# Patient Record
Sex: Female | Born: 1980 | Race: Black or African American | Hispanic: No
Health system: Midwestern US, Community
[De-identification: ages and names within clinical notes are randomized; demographics above are authoritative.]

## PROBLEM LIST (undated history)

## (undated) DIAGNOSIS — E119 Type 2 diabetes mellitus without complications: Secondary | ICD-10-CM

## (undated) DIAGNOSIS — B009 Herpesviral infection, unspecified: Secondary | ICD-10-CM

## (undated) HISTORY — PX: OTHER SURGICAL HISTORY: SHX169

## (undated) HISTORY — DX: Herpesviral infection, unspecified: B00.9

## (undated) HISTORY — DX: Type 2 diabetes mellitus without complications: E11.9

---
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
## 1998-02-28 ENCOUNTER — Inpatient Hospital Stay (HOSPITAL_COMMUNITY): Admission: AD | Admit: 1998-02-28 | Discharge: 1998-02-28 | Payer: Self-pay | Admitting: *Deleted

## 1998-11-28 ENCOUNTER — Inpatient Hospital Stay (HOSPITAL_COMMUNITY): Admission: AD | Admit: 1998-11-28 | Discharge: 1998-11-28 | Payer: Self-pay | Admitting: Obstetrics & Gynecology

## 2000-04-21 ENCOUNTER — Emergency Department (HOSPITAL_COMMUNITY): Admission: EM | Admit: 2000-04-21 | Discharge: 2000-04-21 | Payer: Self-pay | Admitting: Emergency Medicine

## 2000-09-20 ENCOUNTER — Inpatient Hospital Stay (HOSPITAL_COMMUNITY): Admission: AD | Admit: 2000-09-20 | Discharge: 2000-09-20 | Payer: Self-pay | Admitting: *Deleted

## 2000-09-20 ENCOUNTER — Encounter (INDEPENDENT_AMBULATORY_CARE_PROVIDER_SITE_OTHER): Payer: Self-pay

## 2000-10-03 ENCOUNTER — Encounter: Admission: RE | Admit: 2000-10-03 | Discharge: 2000-10-03 | Payer: Self-pay | Admitting: Obstetrics & Gynecology

## 2001-03-21 ENCOUNTER — Encounter: Admission: RE | Admit: 2001-03-21 | Discharge: 2001-03-21 | Payer: Self-pay

## 2001-09-02 ENCOUNTER — Other Ambulatory Visit: Admission: RE | Admit: 2001-09-02 | Discharge: 2001-09-02 | Payer: Self-pay | Admitting: Obstetrics and Gynecology

## 2001-10-16 ENCOUNTER — Inpatient Hospital Stay (HOSPITAL_COMMUNITY): Admission: AD | Admit: 2001-10-16 | Discharge: 2001-10-16 | Payer: Self-pay | Admitting: Obstetrics and Gynecology

## 2001-12-09 ENCOUNTER — Encounter: Admission: RE | Admit: 2001-12-09 | Discharge: 2001-12-16 | Payer: Self-pay | Admitting: Obstetrics and Gynecology

## 2001-12-19 ENCOUNTER — Encounter (HOSPITAL_COMMUNITY): Admission: AD | Admit: 2001-12-19 | Discharge: 2002-01-18 | Payer: Self-pay | Admitting: Obstetrics and Gynecology

## 2001-12-30 ENCOUNTER — Encounter: Payer: Self-pay | Admitting: Obstetrics and Gynecology

## 2002-01-06 ENCOUNTER — Encounter: Payer: Self-pay | Admitting: Obstetrics and Gynecology

## 2002-01-13 ENCOUNTER — Encounter: Payer: Self-pay | Admitting: Obstetrics and Gynecology

## 2002-01-20 ENCOUNTER — Encounter: Payer: Self-pay | Admitting: Obstetrics and Gynecology

## 2002-01-20 ENCOUNTER — Encounter (HOSPITAL_COMMUNITY): Admission: RE | Admit: 2002-01-20 | Discharge: 2002-01-21 | Payer: Self-pay | Admitting: Obstetrics and Gynecology

## 2002-01-23 ENCOUNTER — Inpatient Hospital Stay (HOSPITAL_COMMUNITY): Admission: AD | Admit: 2002-01-23 | Discharge: 2002-01-25 | Payer: Self-pay | Admitting: Obstetrics and Gynecology

## 2002-02-23 ENCOUNTER — Other Ambulatory Visit: Admission: RE | Admit: 2002-02-23 | Discharge: 2002-02-23 | Payer: Self-pay | Admitting: Obstetrics and Gynecology

## 2004-10-12 ENCOUNTER — Other Ambulatory Visit: Admission: RE | Admit: 2004-10-12 | Discharge: 2004-10-12 | Payer: Self-pay | Admitting: Obstetrics and Gynecology

## 2005-11-17 ENCOUNTER — Emergency Department (HOSPITAL_COMMUNITY): Admission: EM | Admit: 2005-11-17 | Discharge: 2005-11-17 | Payer: Self-pay | Admitting: Emergency Medicine

## 2005-12-23 ENCOUNTER — Emergency Department (HOSPITAL_COMMUNITY): Admission: EM | Admit: 2005-12-23 | Discharge: 2005-12-23 | Payer: Self-pay | Admitting: Emergency Medicine

## 2006-02-06 ENCOUNTER — Ambulatory Visit (HOSPITAL_COMMUNITY): Admission: RE | Admit: 2006-02-06 | Discharge: 2006-02-07 | Payer: Self-pay | Admitting: General Surgery

## 2006-02-06 ENCOUNTER — Encounter (INDEPENDENT_AMBULATORY_CARE_PROVIDER_SITE_OTHER): Payer: Self-pay | Admitting: *Deleted

## 2007-11-25 ENCOUNTER — Other Ambulatory Visit: Admission: RE | Admit: 2007-11-25 | Discharge: 2007-11-25 | Payer: Self-pay | Admitting: Gynecology

## 2008-04-28 ENCOUNTER — Ambulatory Visit: Payer: Self-pay | Admitting: Diagnostic Radiology

## 2008-04-28 ENCOUNTER — Emergency Department (HOSPITAL_BASED_OUTPATIENT_CLINIC_OR_DEPARTMENT_OTHER): Admission: EM | Admit: 2008-04-28 | Discharge: 2008-04-28 | Payer: Self-pay | Admitting: Emergency Medicine

## 2008-06-02 ENCOUNTER — Ambulatory Visit: Payer: Self-pay | Admitting: Gynecology

## 2008-07-05 ENCOUNTER — Ambulatory Visit: Payer: Self-pay | Admitting: Gynecology

## 2008-08-27 ENCOUNTER — Ambulatory Visit: Payer: Self-pay | Admitting: Diagnostic Radiology

## 2008-08-27 ENCOUNTER — Emergency Department (HOSPITAL_BASED_OUTPATIENT_CLINIC_OR_DEPARTMENT_OTHER): Admission: EM | Admit: 2008-08-27 | Discharge: 2008-08-27 | Payer: Self-pay | Admitting: Emergency Medicine

## 2010-01-09 ENCOUNTER — Emergency Department (HOSPITAL_BASED_OUTPATIENT_CLINIC_OR_DEPARTMENT_OTHER): Admission: EM | Admit: 2010-01-09 | Discharge: 2010-01-09 | Payer: Self-pay | Admitting: Emergency Medicine

## 2010-01-12 ENCOUNTER — Ambulatory Visit: Payer: Self-pay | Admitting: Gynecology

## 2010-01-12 ENCOUNTER — Other Ambulatory Visit: Admission: RE | Admit: 2010-01-12 | Discharge: 2010-01-12 | Payer: Self-pay | Admitting: Gynecology

## 2010-03-17 IMAGING — CR DG CHEST 2V
2 series · 2 of 2 positions shown · non-contrast
Comparison: 11/17/2005

CLINICAL DATA: Congestion, cough and shortness of breath.

CHEST - 2 VIEW

[w chest pa]
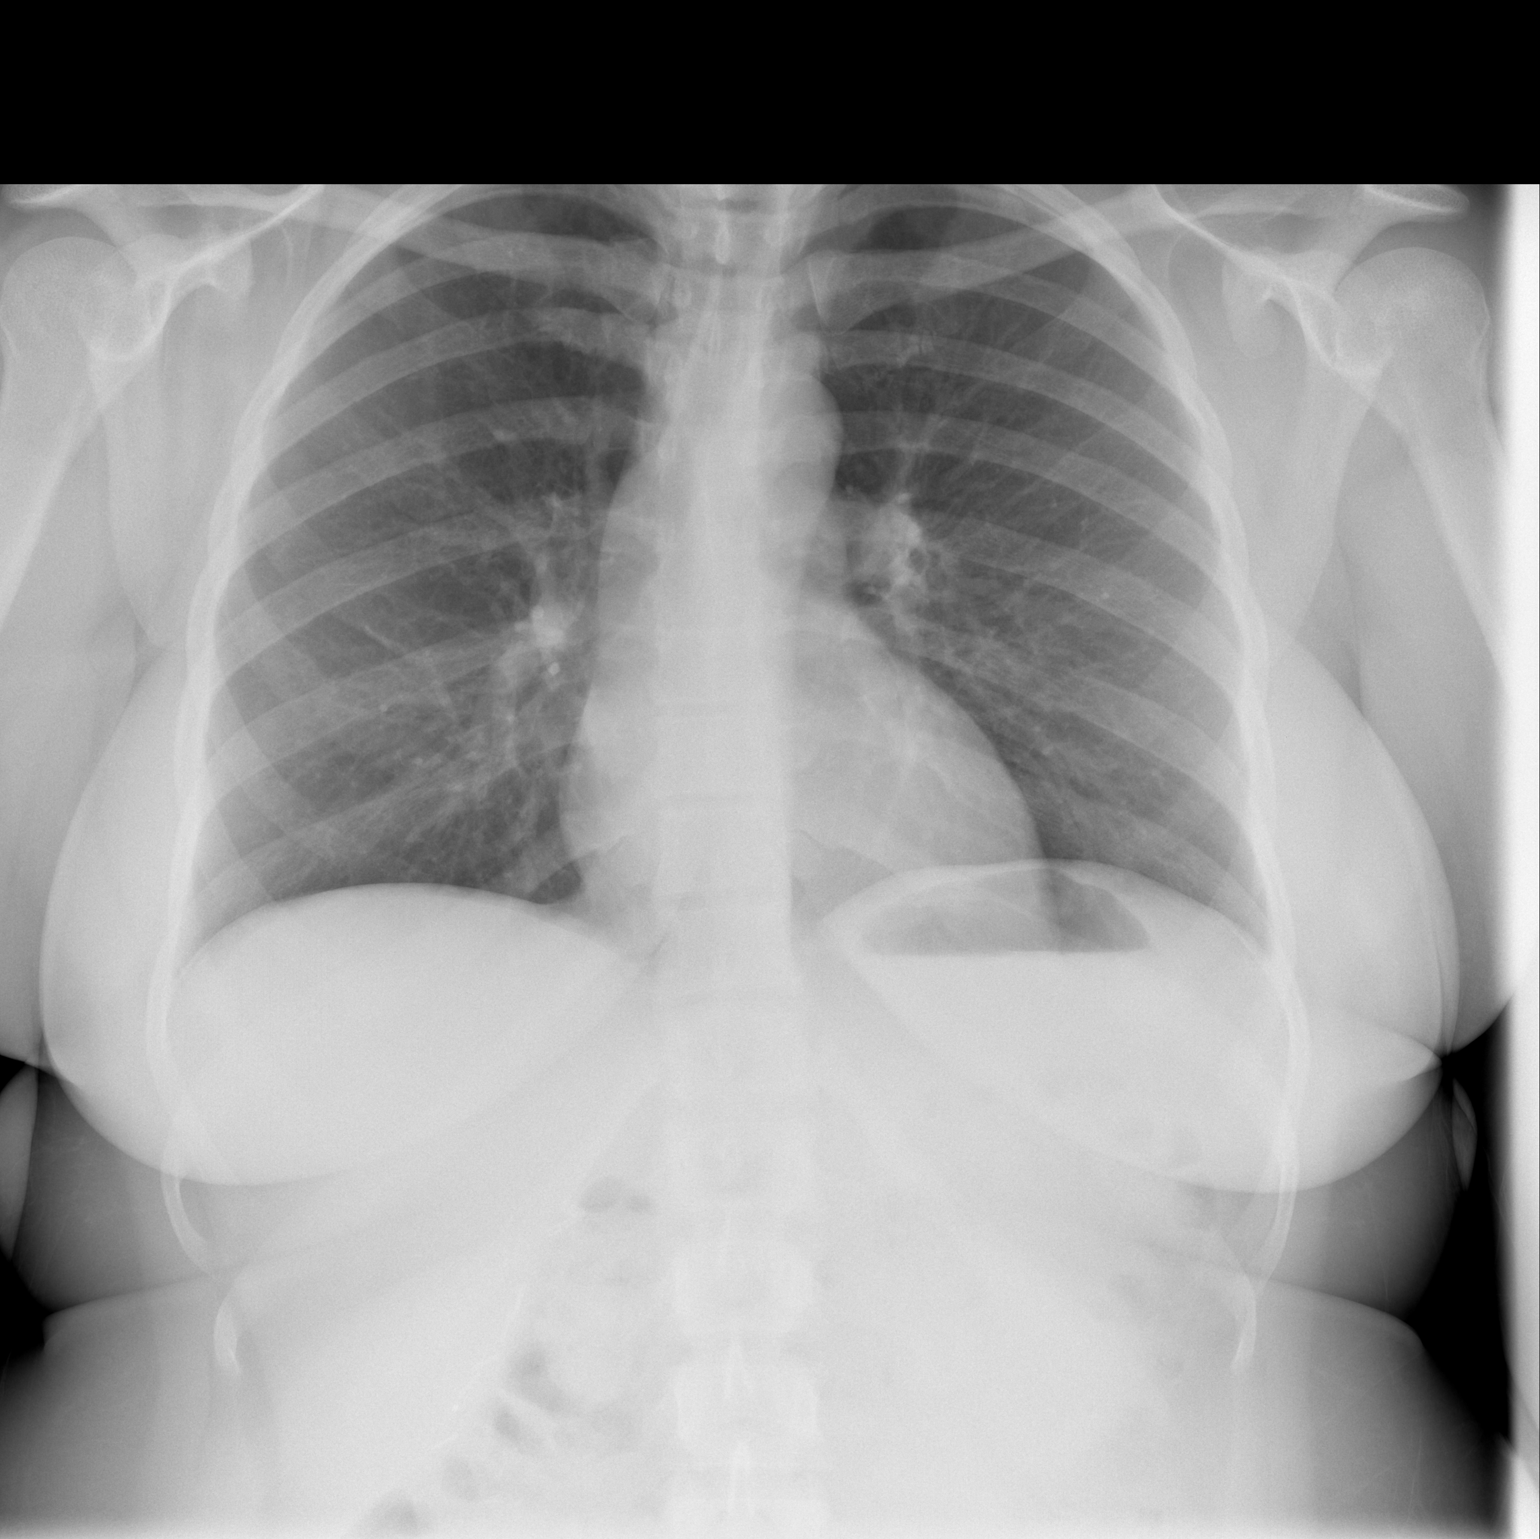

[w chest lat]
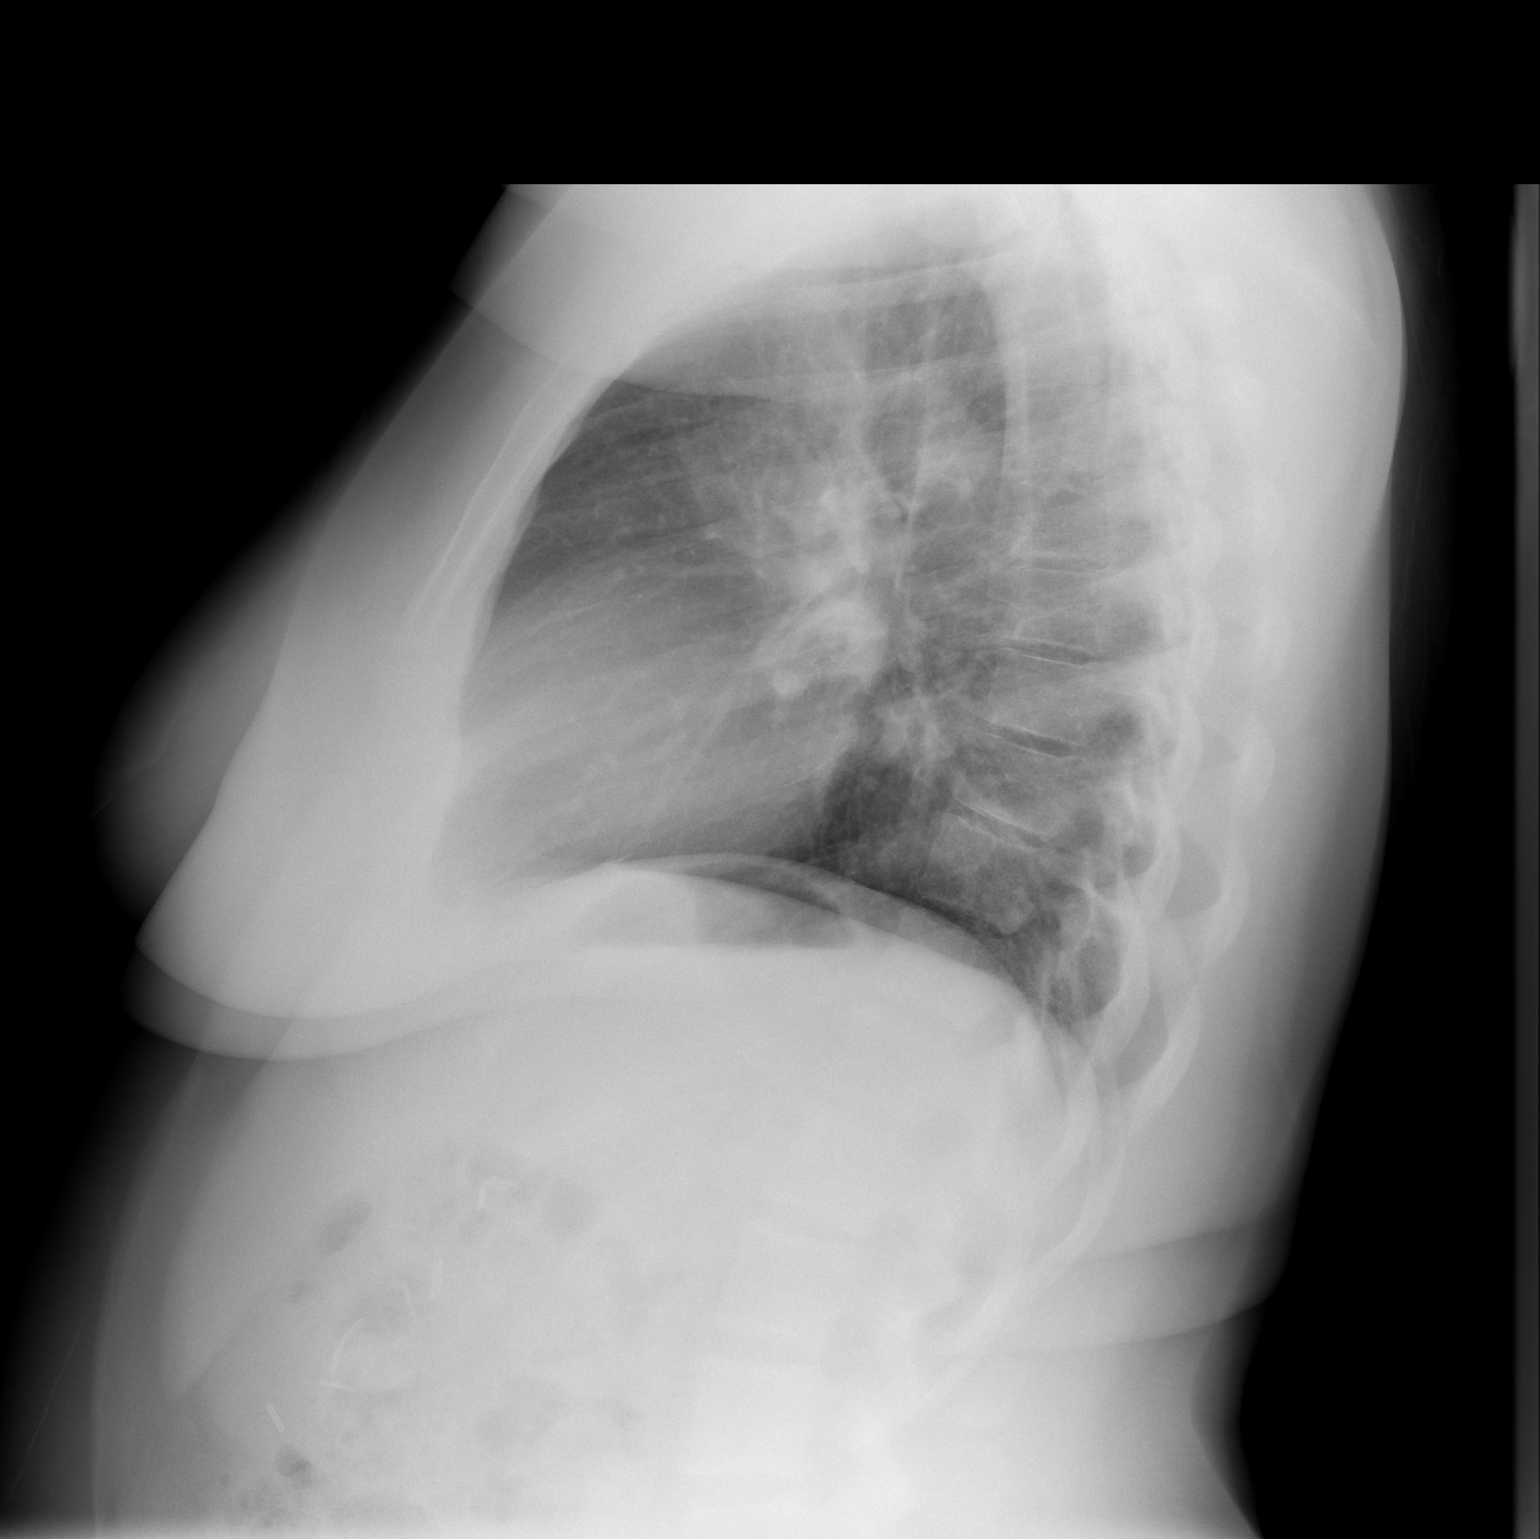

[2 of 2 positions shown; findings below may reference images not displayed]

FINDINGS: Trachea is midline.  Heart size normal.  Lungs are clear.
No pleural fluid.
IMPRESSION: No acute findings.

## 2010-10-06 NOTE — Discharge Summary (Signed)
   NAMEANNTOINETTE, [name] NO.:  [id number]   MEDICAL RECORD NO.:  [id number]                   PATIENT TYPE:  INP   LOCATION:  9118                                 FACILITY:  [facility]   PHYSICIAN:  James A. Ashley Royalty, M.D.             DATE OF BIRTH:  Sep 24, 1980   DATE OF ADMISSION:  01/23/2002  DATE OF DISCHARGE:  01/25/2002                                 DISCHARGE SUMMARY   DISCHARGE DIAGNOSES:  1. Intrauterine pregnancy at [redacted] weeks gestation.  2. Gestational diabetes mellitus.  3. 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  4. Term birth delivery of child, vertex.   OPERATIONS/PLANS/PROCEDURES:  OB delivery with repair of first-degree  periurethral laceration.   CONSULTATIONS:  None.   DISCHARGE MEDICATIONS:  Motrin.   HISTORY AND PHYSICAL:  This is a 30 year old gravida 4, para 0, AB 3 at [redacted]  weeks gestation.  Prenatal care was complicated by history of genital HSV  (not culture proven) and gestational diabetes with good control of diet  only.  The patient was admitted for induction secondary to the above risk  factors.   HOSPITAL COURSE:  The patient was admitted to [facility]  [place].  Admission laboratory studies were drawn.  On initial cervical  exam, she was 2-3 cm dilated, 80-90% effaced, minus 1 station, vertex  presentation.  The patient went on through labor and delivered on January 23, 2002.  The infant was a 7 pound 11 ounce female, Apgars 8 at one minute and  9 at five minutes sent to the newborn nursery.  Delivery was accomplished by  Fayrene Fearing A. Ashley Royalty, M.D. over an intact perineum.  There was a first-degree  periurethral laceration on the left side.  This was repaired without  difficulty.  The patient's postpartum course was benign.   DISPOSITION:  She was discharged to home in satisfactory condition.   ACCESSORY CLINICAL FINDINGS:  Hemoglobin/hematocrit on admission were 12.4  and 36.5 respectively.  Repeat  values obtained on 01/21/2002 were 11.5 and  33.9 respectively.   FOLLOW UP:  The patient is to return to [facility] in 4-6 weeks for  postpartum evaluation.                                               James A. Ashley Royalty, M.D.    JAM/MEDQ  D:  03/10/2002  T:  03/10/2002  Job:  [id number]

## 2010-10-06 NOTE — Op Note (Signed)
[name], [name] NO.:  [id number]   MEDICAL RECORD NO.:  [id number]          PATIENT TYPE:  OIB   LOCATION:  1604                         FACILITY:  [facility]   PHYSICIAN:  Ollen Gross. Vernell Morgans, M.D. DATE OF BIRTH:  [birth year]   DATE OF PROCEDURE:  02/06/2006  DATE OF DISCHARGE:  02/07/2006                                 OPERATIVE REPORT   PREOPERATIVE DIAGNOSIS:  Gallstones.   POSTOPERATIVE DIAGNOSIS:  Gallstones.   PROCEDURE:  Laparoscopic cholecystectomy with intraoperative cholangiogram.   SURGEON:  Dr. Carolynne Edouard.   ASSISTANT:  Dr. Colin Benton.   ANESTHESIA:  General endotracheal.   PROCEDURE:  After informed consent was obtained, the patient was brought to  the operating room, placed in a supine position on the operating room table.  After adequate induction of general anesthesia, the patient's abdomen was  prepped with Betadine and draped in usual sterile manner.  The area beneath  the umbilicus was infiltrated with 0.25% Marcaine.  Small incision was made  with 15 blade knife.  This incision was carried down through the  subcutaneous tissue bluntly with hemostat and [employer] retractors until the  linea alba was identified.  Linea alba was incised with a 15 blade knife,  and each side was grasped with Kocher clamps and elevated anteriorly.  Preperitoneal space was probed bluntly with a hemostat until the peritoneum  was opened and access was gained to the abdominal cavity.  A 0 Vicryl  pursestring stitch was placed in the fascia around the opening.  A Hasson  cannula was placed through the opening and anchored in place with the  previously placed Vicryl pursestring stitch.  The abdomen was then  insufflated with carbon dioxide without difficulty.  A laparoscope was  inserted through the Hasson cannula, and the right upper quadrant was  inspected.  The dome of gallbladder and liver readily identified.  Next, the  epigastric region was infiltrated with 0.25%  Marcaine.  A small incision was  made with a 15 blade knife.  A 10-mm port was placed bluntly through this  incision into the abdominal cavity under direct vision.  Sites were then  chosen lateral on the right side of the abdomen with placement of 5-mm  ports.  Each of these areas was infiltrated with 0.25% Marcaine.  Small stab  incisions were made with a 15 blade knife, and 5-mm ports were placed  bluntly through these incisions into the abdominal cavity under direct  vision.  A blunt grasper was placed through the lateral most 5-mm port and  used to grasp the dome of gallbladder and elevated anteriorly and  superiorly.  Another blunt grasper was placed through the other 5-mm port  and used to retract on the body and neck of the gallbladder.  A dissector  was placed through the epigastric port, and using electrocautery, the  peritoneal reflection of the gallbladder neck area was opened.  Blunt  dissection was then carried out into this area until the gallbladder neck  cystic duct junction was readily identified, and a good window was created.  A single clip was placed on  the gallbladder neck.  A small ductotomy was  made just below the clip with the laparoscopic scissors.  A 14-gauge  Angiocath was placed percutaneously through the anterior abdominal wall  under direct vision.  A Reddick cholangiogram catheter was placed through  the Angiocath and flushed.  The Reddick catheter was then placed within the  cystic duct and anchored in place with a clip.  The cholangiogram was  obtained that showed no filling defects, good emptying in the duodenum and  adequate length on the cystic duct.  The anchoring clip and catheters were  removed from the patient.  Three clips were placed proximally on the cystic  duct, and the duct was divided between the two sets of clips.  Posterior to  this, the cystic artery was identified and again dissected bluntly in a  circumferential manner until a good window  was created.  Two clips were  placed proximally and one distally on the artery, and the artery was divided  between the two.  Next, a laparoscopic hook cautery device was used to  separate the gallbladder from the liver bed.  Prior to completely detaching  the gallbladder from the liver bed, the liver bed was inspected, and several  small bleeding points were coagulated with the electrocautery until the area  was completely hemostatic.  The gallbladder was then detached the rest of  the way from the liver bed without difficulty.  A laparoscopic bag was  inserted through the epigastric port, and the gallbladder was placed within  the bag, and bag was sealed.  The abdomen was then irrigated with copious  amounts of saline until the effluent was clear.  The laparoscope was then  removed through the epigastric port, and a gallbladder grasper was placed  through the Hasson cannula and used to grasp the opening of the bag.  The  bag with the gallbladder was removed through the infraumbilical port with  the Hasson cannula without difficulty.  The fascial defect was closed with  the previously placed Vicryl pursestring stitch as well as with another  figure-of-eight 0 Vicryl stitch.  The rest of ports were removed under  direct vision.  The gas was allowed to escape.  The skin incisions were all  closed with interrupted 4-0 Monocryl subcuticular stitches.  Benzoin and  Steri-Strips were applied.  The patient tolerated well.  At the end of the  case, all needle, sponge and instrument counts were correct.  The patient  was then awaken and taken to recovery in stable condition.      Ollen Gross. Vernell Morgans, M.D.  Electronically Signed     PST/MEDQ  D:  02/10/2006  T:  02/12/2006  Job:  [id number]

## 2010-11-27 ENCOUNTER — Encounter: Payer: Self-pay | Admitting: *Deleted

## 2011-01-19 ENCOUNTER — Other Ambulatory Visit: Payer: Self-pay | Admitting: Gynecology

## 2011-01-19 NOTE — Telephone Encounter (Signed)
PT HAS ANNUAL SCHEDULED ON 02/12/11 @ 10:00

## 2011-02-12 ENCOUNTER — Encounter: Payer: Self-pay | Admitting: Gynecology

## 2011-03-14 ENCOUNTER — Other Ambulatory Visit: Payer: Self-pay | Admitting: Gynecology

## 2011-03-14 NOTE — Telephone Encounter (Signed)
Needs annual exam

## 2011-07-25 ENCOUNTER — Ambulatory Visit (INDEPENDENT_AMBULATORY_CARE_PROVIDER_SITE_OTHER): Payer: Managed Care, Other (non HMO) | Admitting: Gynecology

## 2011-07-25 ENCOUNTER — Other Ambulatory Visit: Payer: Self-pay | Admitting: Gynecology

## 2011-07-25 ENCOUNTER — Encounter: Payer: Self-pay | Admitting: Gynecology

## 2011-07-25 DIAGNOSIS — N390 Urinary tract infection, site not specified: Secondary | ICD-10-CM

## 2011-07-25 DIAGNOSIS — N760 Acute vaginitis: Secondary | ICD-10-CM

## 2011-07-25 DIAGNOSIS — Z113 Encounter for screening for infections with a predominantly sexual mode of transmission: Secondary | ICD-10-CM

## 2011-07-25 DIAGNOSIS — A6000 Herpesviral infection of urogenital system, unspecified: Secondary | ICD-10-CM

## 2011-07-25 DIAGNOSIS — B9689 Other specified bacterial agents as the cause of diseases classified elsewhere: Secondary | ICD-10-CM

## 2011-07-25 DIAGNOSIS — R350 Frequency of micturition: Secondary | ICD-10-CM

## 2011-07-25 DIAGNOSIS — N898 Other specified noninflammatory disorders of vagina: Secondary | ICD-10-CM

## 2011-07-25 DIAGNOSIS — A499 Bacterial infection, unspecified: Secondary | ICD-10-CM

## 2011-07-25 LAB — URINALYSIS W MICROSCOPIC + REFLEX CULTURE
Nitrite: POSITIVE — AB
Protein, ur: NEGATIVE mg/dL

## 2011-07-25 LAB — HEPATITIS B SURFACE ANTIGEN: Hepatitis B Surface Ag: NEGATIVE

## 2011-07-25 LAB — WET PREP FOR TRICH, YEAST, CLUE
Trich, Wet Prep: NONE SEEN
Yeast Wet Prep HPF POC: NONE SEEN

## 2011-07-25 MED ORDER — VALACYCLOVIR HCL 500 MG PO TABS: 500.0000 mg | ORAL_TABLET | Freq: Two times a day (BID) | ORAL | Status: DC

## 2011-07-25 MED ORDER — METRONIDAZOLE 500 MG PO TABS: 500.0000 mg | ORAL_TABLET | Freq: Two times a day (BID) | ORAL | Status: AC

## 2011-07-25 MED ORDER — NITROFURANTOIN MONOHYD MACRO 100 MG PO CAPS: 100.0000 mg | ORAL_CAPSULE | Freq: Two times a day (BID) | ORAL | Status: AC

## 2011-07-25 NOTE — Patient Instructions (Signed)
Take medication as prescribed. Follow up for annual eaxm

## 2011-07-25 NOTE — Progress Notes (Signed)
Patient presents for several issues. Recently treated for UTI with antibiotics but does note that her symptoms seem to have persisted with frequency and dysuria. She's also having a heavy vaginal discharge with odor. Notes some vulvar sores and wonders if she's having a herpes outbreak. No fevers chills nausea vomiting diarrhea constipation low back pain or other constitutional symptoms.  Exam with [name] chaperone present Spine straight no CVA tenderness Abdomen soft nontender without masses guarding rebound organomegaly Pelvic external with several small linear ulcerations inner aspect of right labia majora consistent with HSV. Vagina with frothy yellow discharge. Cervix normal. Uterus normal size midline mobile nontender. Adnexa without masses or tenderness.  Assessment and plan: 1. Vaginal discharge. Wet prep positive for BV. We'll treat with Flagyl 500 twice a day x7 days. 2. Vulvar ulceration. She notes this is the area where she normally gets her HSV outbreak. We'll treat with Valtrex 500 twice a day x5 days. 3. Frequency dysuria. Recent treatment for UTI. UA consistent with a low-grade UTI. We'll treat with Macrobid 100 twice a day x7 days. 4. Health maintenance. Patient is overdue for annual I recommend she schedule this and she agrees to do so. She'll follow up if any of her above symptoms persist or recur.

## 2011-07-26 LAB — GC/CHLAMYDIA PROBE AMP, GENITAL
Chlamydia, DNA Probe: NEGATIVE
GC Probe Amp, Genital: NEGATIVE

## 2011-07-26 LAB — RPR

## 2011-07-27 ENCOUNTER — Telehealth: Payer: Self-pay | Admitting: *Deleted

## 2011-07-27 NOTE — Telephone Encounter (Signed)
Message copied by Aura Camps on Fri Jul 27, 2011 11:38 AM ------      Message from: Leanor Kail      Created: Wed Jul 25, 2011 11:45 AM       Victorino Dike,                  Patient had STD testing today and would like a call either way with the results @ [phone].            Thanks

## 2011-07-27 NOTE — Telephone Encounter (Signed)
Pt informed of recent STD screening negative.

## 2011-08-22 ENCOUNTER — Encounter: Payer: Managed Care, Other (non HMO) | Admitting: Gynecology

## 2011-09-19 ENCOUNTER — Encounter: Payer: Self-pay | Admitting: Gynecology

## 2011-09-19 ENCOUNTER — Ambulatory Visit (INDEPENDENT_AMBULATORY_CARE_PROVIDER_SITE_OTHER): Payer: Managed Care, Other (non HMO) | Admitting: Gynecology

## 2011-09-19 ENCOUNTER — Other Ambulatory Visit (HOSPITAL_COMMUNITY)
Admission: RE | Admit: 2011-09-19 | Discharge: 2011-09-19 | Disposition: A | Discharge status: Home / Self Care | Payer: Managed Care, Other (non HMO) | Source: Ambulatory Visit | Attending: Gynecology | Admitting: Gynecology

## 2011-09-19 VITALS — BP 112/70 | Ht 67.5 in | Wt 254.0 lb

## 2011-09-19 DIAGNOSIS — Z13220 Encounter for screening for lipoid disorders: Secondary | ICD-10-CM

## 2011-09-19 DIAGNOSIS — N63 Unspecified lump in unspecified breast: Secondary | ICD-10-CM

## 2011-09-19 DIAGNOSIS — Z01419 Encounter for gynecological examination (general) (routine) without abnormal findings: Secondary | ICD-10-CM

## 2011-09-19 DIAGNOSIS — A6000 Herpesviral infection of urogenital system, unspecified: Secondary | ICD-10-CM

## 2011-09-19 DIAGNOSIS — Z30431 Encounter for routine checking of intrauterine contraceptive device: Secondary | ICD-10-CM

## 2011-09-19 DIAGNOSIS — R635 Abnormal weight gain: Secondary | ICD-10-CM

## 2011-09-19 LAB — COMPREHENSIVE METABOLIC PANEL
Albumin: 4.2 g/dL (ref 3.5–5.2)
BUN: 17 mg/dL (ref 6–23)
CO2: 26 mEq/L (ref 19–32)
Calcium: 9.5 mg/dL (ref 8.4–10.5)
Chloride: 105 mEq/L (ref 96–112)
Creat: 1.05 mg/dL (ref 0.50–1.10)
Glucose, Bld: 94 mg/dL (ref 70–99)
Potassium: 4.4 mEq/L (ref 3.5–5.3)

## 2011-09-19 LAB — CBC WITH DIFFERENTIAL/PLATELET
Basophils Absolute: 0 10*3/uL (ref 0.0–0.1)
Eosinophils Relative: 2 % (ref 0–5)
HCT: 40.8 % (ref 36.0–46.0)
Hemoglobin: 12.6 g/dL (ref 12.0–15.0)
Lymphocytes Relative: 30 % (ref 12–46)
MCV: 96.5 fL (ref 78.0–100.0)
Monocytes Absolute: 0.5 10*3/uL (ref 0.1–1.0)
Monocytes Relative: 5 % (ref 3–12)
RDW: 13.2 % (ref 11.5–15.5)
WBC: 9.9 10*3/uL (ref 4.0–10.5)

## 2011-09-19 LAB — LIPID PANEL
Cholesterol: 160 mg/dL (ref 0–200)
HDL: 33 mg/dL — ABNORMAL LOW (ref >39)
Total CHOL/HDL Ratio: 4.8 Ratio
Triglycerides: 141 mg/dL (ref <150)

## 2011-09-19 MED ORDER — VALACYCLOVIR HCL 500 MG PO TABS: 500.0000 mg | ORAL_TABLET | Freq: Every day | ORAL | Status: AC

## 2011-09-19 NOTE — Progress Notes (Signed)
Patient ID: [name], female   DOB: 1980-10-05, 31 y.o.   MRN: [id number] 31 y.o.  for annual exam.  Several issues noted below.  Past medical history,surgical history, medications, allergies, family history and social history were all reviewed and documented in the EPIC chart. ROS:  Was performed and pertinent positives and negatives are included in the history.  Exam: [name] chaperone present Filed Vitals:   09/19/11 0912  BP: 112/70   General appearance  Normal Skin grossly normal Head/Neck normal with no cervical or supraclavicular adenopathy thyroid normal Lungs  clear Cardiac RR, without RMG Abdominal  soft, nontender, without masses, organomegaly or hernia Breasts  examined lying and sitting. Right without masses, retractions, discharge or axillary adenopathy.  Left with nodularity 12:00 position 2 fingers off the areola tender to the patient. No overlying skin changes, nipple discharge, axillary adenopathy.  Physical Exam  Pulmonary/Chest:      Pelvic  Ext/BUS/vagina  normal   Cervix  normal  Pap done IUD string visualized  Uterus  anteverted, normal size, shape and contour, midline and mobile nontender   Adnexa  Without masses or tenderness    Anus and perineum  normal   Rectovaginal  normal sphincter tone without palpated masses or tenderness.    Assessment/Plan:  31 y.o. female for annual exam.    1. Nodularity left breast. Patient noted on exam that this area was tender today and she really did not notice it previously. She does have some nodularity in this region. We'll start with baseline ultrasound/mammogram. Various scenarios were reviewed up to and including general surgical referral for excision. Patient will follow up for her studies and we will go from there. 2. Mirena IUD.  Placed January 2010. String visualized. Doing well with this with light menses. 3. Herpes genitalis. Patient has been using Valtrex 500 mg daily for suppression but ran out. She wants  to continue on a daily suppressive dose and I refilled her Valtrex 500 mg daily times a year. 4. Pap smear. She has not had a Pap smear in 2 years I went ahead and did one today. 5. STD screening. She recently had STD screening in March to include GC chlamydia hepatitis B hepatitis C HIV RPR all of which were negative. 6. Difficulty losing weight. I reviewed the issues of calories in/calorie out and the need for attention to exercise and diet.  Weight Watchers and other similar programs were reviewed. We'll check baseline TSH and conference metabolic panel. 7. Health maintenance Baseline CBC, lipid profile, comprehensive metabolic panel, TSH and urinalysis ordered.

## 2011-09-19 NOTE — Patient Instructions (Signed)
Office will contact you to arrange ultrasound and mammogram. 

## 2011-09-19 NOTE — Progress Notes (Deleted)
Patient ID: [name], female   DOB: Mar 14, 1981, 31 y.o.   MRN: [id number]

## 2011-09-20 ENCOUNTER — Telehealth: Payer: Self-pay | Admitting: *Deleted

## 2011-09-20 ENCOUNTER — Other Ambulatory Visit: Payer: Self-pay | Admitting: *Deleted

## 2011-09-20 DIAGNOSIS — N63 Unspecified lump in unspecified breast: Secondary | ICD-10-CM

## 2011-09-20 LAB — URINALYSIS W MICROSCOPIC + REFLEX CULTURE
Bacteria, UA: NONE SEEN
Casts: NONE SEEN
Crystals: NONE SEEN
Glucose, UA: NEGATIVE mg/dL
Hgb urine dipstick: NEGATIVE
Ketones, ur: NEGATIVE mg/dL
Nitrite: NEGATIVE
Specific Gravity, Urine: 1.021 (ref 1.005–1.030)
pH: 5.5 (ref 5.0–8.0)

## 2011-09-20 NOTE — Telephone Encounter (Signed)
Message copied by Libby Maw on Thu Sep 20, 2011 11:40 AM ------      Message from: [name]      Created: Wed Sep 19, 2011 10:28 AM       Schedule diagnostic mammography and ultrasound reference tender nodularity left breast 12:00 position 2 fingerbreadths off the areola

## 2011-09-20 NOTE — Telephone Encounter (Signed)
Patient informed appt set up at [facility] [place] on 09/26/11 @ 7:30. Orders in pc.

## 2011-09-26 ENCOUNTER — Ambulatory Visit
Admission: RE | Admit: 2011-09-26 | Discharge: 2011-09-26 | Disposition: A | Discharge status: Home / Self Care | Payer: Managed Care, Other (non HMO) | Source: Ambulatory Visit | Attending: Gynecology | Admitting: Gynecology

## 2011-09-26 DIAGNOSIS — N63 Unspecified lump in unspecified breast: Secondary | ICD-10-CM

## 2011-10-17 ENCOUNTER — Ambulatory Visit (INDEPENDENT_AMBULATORY_CARE_PROVIDER_SITE_OTHER): Payer: Managed Care, Other (non HMO) | Admitting: Gynecology

## 2011-10-17 ENCOUNTER — Encounter: Payer: Self-pay | Admitting: Gynecology

## 2011-10-17 DIAGNOSIS — J302 Other seasonal allergic rhinitis: Secondary | ICD-10-CM

## 2011-10-17 DIAGNOSIS — N644 Mastodynia: Secondary | ICD-10-CM

## 2011-10-17 DIAGNOSIS — J309 Allergic rhinitis, unspecified: Secondary | ICD-10-CM

## 2011-10-17 NOTE — Patient Instructions (Signed)
Monitor area in left breast. As long as it remains unchanged or resolve some we will follow. If it enlarges or changes represent for further evaluation. Use decongestant/allergy medication. If tender lymph node in your neck persists or new areas develop then represent for further evaluation.

## 2011-10-17 NOTE — Progress Notes (Signed)
Patient follows up for breast recheck. She had an area of nodularity left breast 12:00 position of the areola. Mammogram and ultrasound were negative.  Patient also is having some issues with allergies and a slight sore throat.  Exam was Sherri chaperone present HEENT with small mobile lymph node right superior anterior cervical chain. Mildly tender. No other abnormalities.  Both breast examined lying and sitting without definitive masses, retractions, discharge or adenopathy. She does have some fibroglandular nodularity at the 12:00 position in both breasts.   Assessment and plan: 1. Left breast nodularity. I think this is physiologic as I do feel a corresponding area of the right breast. Her mammogram ultrasound were negative. I recommended that she continue with self breast exams monthly. As long as these areas remain unchanged we will follow. If they change at all she knows to represent for further evaluation up to including possible general surgical referral. 2. Allergies with sore throat. Single tender small cervical lymph node. Patient will monitor. Use decongestants. If this area resolves and follow. If this area enlarges or other areas present then she knows to represent for further evaluation.

## 2012-09-26 ENCOUNTER — Encounter: Payer: Self-pay | Admitting: Gynecology

## 2012-11-11 ENCOUNTER — Telehealth: Payer: Self-pay | Admitting: *Deleted

## 2012-11-11 MED ORDER — VALACYCLOVIR HCL 500 MG PO TABS: 500.0000 mg | ORAL_TABLET | Freq: Every day | ORAL | Status: DC

## 2012-11-11 NOTE — Telephone Encounter (Signed)
Pt has annual scheduled 12/16/12 requesting refill on Valtrex 500 mg, rx sent.

## 2012-11-13 ENCOUNTER — Encounter: Payer: Managed Care, Other (non HMO) | Admitting: Gynecology

## 2012-12-16 ENCOUNTER — Encounter: Payer: Managed Care, Other (non HMO) | Admitting: Gynecology

## 2012-12-17 ENCOUNTER — Encounter: Payer: Self-pay | Admitting: Gynecology

## 2012-12-17 ENCOUNTER — Ambulatory Visit (INDEPENDENT_AMBULATORY_CARE_PROVIDER_SITE_OTHER): Payer: Managed Care, Other (non HMO) | Admitting: Gynecology

## 2012-12-17 VITALS — BP 130/84 | Ht 67.0 in | Wt 230.0 lb

## 2012-12-17 DIAGNOSIS — Z113 Encounter for screening for infections with a predominantly sexual mode of transmission: Secondary | ICD-10-CM

## 2012-12-17 DIAGNOSIS — K649 Unspecified hemorrhoids: Secondary | ICD-10-CM

## 2012-12-17 DIAGNOSIS — B009 Herpesviral infection, unspecified: Secondary | ICD-10-CM

## 2012-12-17 DIAGNOSIS — Z30431 Encounter for routine checking of intrauterine contraceptive device: Secondary | ICD-10-CM

## 2012-12-17 DIAGNOSIS — R21 Rash and other nonspecific skin eruption: Secondary | ICD-10-CM

## 2012-12-17 DIAGNOSIS — Z01419 Encounter for gynecological examination (general) (routine) without abnormal findings: Secondary | ICD-10-CM

## 2012-12-17 MED ORDER — VALACYCLOVIR HCL 500 MG PO TABS: 500.0000 mg | ORAL_TABLET | Freq: Every day | ORAL | Status: DC

## 2012-12-17 MED ORDER — BETAMETHASONE DIPROPIONATE AUG 0.05 % EX CREA: TOPICAL_CREAM | Freq: Two times a day (BID) | CUTANEOUS | Status: DC

## 2012-12-17 NOTE — Progress Notes (Signed)
CHESSICA AUDIA Jun 18, 1980 [id number]        32 y.o.  [id number] for annual exam.  Several issues below.  Past medical history,surgical history, medications, allergies, family history and social history were all reviewed and documented in the EPIC chart.  ROS:  Performed and pertinent positives and negatives are included in the history, assessment and plan .  Exam: Kim assistant Filed Vitals:   12/17/12 0839  BP: 130/84  Height: 5\' 7"  (1.702 m)  Weight: 230 lb (104.327 kg)   General appearance  Normal Skin grossly normal Head/Neck normal with no cervical or supraclavicular adenopathy thyroid normal Lungs  clear Cardiac RR, without RMG Abdominal  soft, nontender, without masses, organomegaly or hernia Breasts  examined lying and sitting without masses, retractions, discharge or axillary adenopathy. Faint patch of scaly erythematous rash right breast 12:00 above the nipple 2 cm across. Pelvic  Ext/BUS/vagina  normal   Cervix  normal with IUD string visualized. GC/Chlamydia  Uterus  anteverted, normal size, shape and contour, midline and mobile nontender   Adnexa  Without masses or tenderness    Anus and perineum  normal   Rectovaginal  normal sphincter tone without palpated masses or tenderness. External hemorrhoids noted.   Assessment/Plan:  32 y.o. [id number] female for annual exam.   1. Mirena IUD 05/2008. Patient due to have it replaced this coming January and I reminded her to schedule this. Otherwise doing well with scant absent menses. 2. STD screening. Patient requests STD screening. No known exposure. GC/Chlamydia, RPR, HIV, hepatitis B, hepatitis C done. 3. Pap smear 2013. No Pap smear done today. No history of significant abnormal Pap smears. Repeat a 3 year interval. 4. Skin rash right breast. Also has issues on her back although exam is normal today. Consistent with small area of eczema. Diprolene 0.05% cream prescribed. It continues she'll call and I will refer to  dermatology. 5. Breast health. History of some nodularity previously with negative ultrasound and mammogram. Exam has remained stable on her self breast exam with no clear palpable abnormalities. Exam today is normal. Continue with SBE monthly. Plan followup mammogram at age 77. 6. Genital herpes. Uses Valtrex 500 mg daily for suppression doing well with this. Refill x1 year. 7. External hemorrhoids. Offered referral to Gen. surgery if she wants to consider treatment. She will followup as she decides. 8. Health maintenance. No other lab work done as it is all done through her primary physician's office. Followup one year, sooner as needed.  Note: This document was prepared with digital dictation and possible smart phrase technology. Any transcriptional errors that result from this process are unintentional.   [name] MD, 8:59 AM 12/17/2012

## 2012-12-17 NOTE — Patient Instructions (Signed)
Apply steroid cream to the skin rash. If it continues call and we will refer to dermatology. Followup with general surgeon if you want to pursue hemorrhoid treatment. Followup in January 2015 for IUD replacement.

## 2012-12-18 LAB — URINALYSIS W MICROSCOPIC + REFLEX CULTURE
Bacteria, UA: NONE SEEN
Casts: NONE SEEN
Glucose, UA: NEGATIVE mg/dL
Hgb urine dipstick: NEGATIVE
Ketones, ur: NEGATIVE mg/dL
Leukocytes, UA: NEGATIVE
Nitrite: NEGATIVE
Protein, ur: NEGATIVE mg/dL
pH: 6 (ref 5.0–8.0)

## 2012-12-18 LAB — HEPATITIS B SURFACE ANTIGEN: Hepatitis B Surface Ag: NEGATIVE

## 2012-12-18 LAB — RPR

## 2012-12-18 LAB — HEPATITIS C ANTIBODY: HCV Ab: NEGATIVE

## 2013-05-21 HISTORY — PX: INTRAUTERINE DEVICE INSERTION: SHX323

## 2013-06-12 ENCOUNTER — Telehealth: Payer: Self-pay | Admitting: Gynecology

## 2013-06-12 ENCOUNTER — Other Ambulatory Visit: Payer: Self-pay | Admitting: Gynecology

## 2013-06-12 DIAGNOSIS — Z3049 Encounter for surveillance of other contraceptives: Secondary | ICD-10-CM

## 2013-06-12 MED ORDER — LEVONORGESTREL 20 MCG/24HR IU IUD: INTRAUTERINE_SYSTEM | Freq: Once | INTRAUTERINE | Status: AC

## 2013-06-12 NOTE — Telephone Encounter (Signed)
06/12/12-PT called today and told her ins covers the removal of her Mirena and insertion of new at 100%, no copay/wl

## 2013-06-16 ENCOUNTER — Ambulatory Visit (INDEPENDENT_AMBULATORY_CARE_PROVIDER_SITE_OTHER): Payer: Managed Care, Other (non HMO) | Admitting: Gynecology

## 2013-06-16 ENCOUNTER — Encounter: Payer: Self-pay | Admitting: Gynecology

## 2013-06-16 DIAGNOSIS — Z30431 Encounter for routine checking of intrauterine contraceptive device: Secondary | ICD-10-CM

## 2013-06-16 MED ORDER — METFORMIN HCL 500 MG PO TABS: 500.0000 mg | ORAL_TABLET | Freq: Every day | ORAL | Status: AC

## 2013-06-16 NOTE — Patient Instructions (Signed)
Intrauterine Device Insertion   Most often, an intrauterine device (IUD) is inserted into the uterus to prevent pregnancy. There are 2 types of IUDs available:  · Copper IUD This type of IUD creates an environment that is not favorable to sperm survival. The mechanism of action of the copper IUD is not known for certain. It can stay in place for 10 years.  · Hormone IUD This type of IUD contains the hormone progestin (synthetic progesterone). The progestin thickens the cervical mucus and prevents sperm from entering the uterus, and it also thins the uterine lining. There is no evidence that the hormone IUD prevents implantation. One hormone IUD can stay in place for up to 5 years, and a different hormone IUD can stay in place for up to 3 years.  An IUD is the most cost-effective birth control if left in place for the full duration. It may be removed at any time.  LET YOUR HEALTH CARE PROVIDER KNOW ABOUT:  · Any allergies you have.  · All medicines you are taking, including vitamins, herbs, eye drops, creams, and over-the-counter medicines.  · Previous problems you or members of your family have had with the use of anesthetics.  · Any blood disorders you have.  · Previous surgeries you have had.  · Possibility of pregnancy.  · Medical conditions you have.  RISKS AND COMPLICATIONS   Generally, intrauterine device insertion is a safe procedure. However, as with any procedure, complications can occur. Possible complications include:  · Accidental puncture (perforation) of the uterus.  · Accidental placement of the IUD either in the muscle layer of the uterus (myometrium) or outside the uterus. If this happens, the IUD can be found essentially floating around the bowels and must be taken out surgically.  · The IUD may fall out of the uterus (expulsion). This is more common in women who have recently had a child.    · Pregnancy in the fallopian tube (ectopic).  · Pelvic inflammatory disease (PID), which is infection of  the uterus and fallopian tubes. The risk of PID is slightly increased in the first 20 days after the IUD is placed, but the overall risk is still very low.  BEFORE THE PROCEDURE  · Schedule the IUD insertion for when you will have your menstrual period or right after, to make sure you are not pregnant. Placement of the IUD is better tolerated shortly after a menstrual cycle.  · You may need to take tests or be examined to make sure you are not pregnant.  · You may be required to take a pregnancy test.  · You may be required to get checked for sexually transmitted infections (STIs) prior to placement. Placing an IUD in someone who has an infection can make the infection worse.  · You may be given a pain reliever to take 1 or 2 hours before the procedure.  · An exam will be performed to determine the size and position of your uterus.  · Ask your health care provider about changing or stopping your regular medicines.  PROCEDURE   · A tool (speculum) is placed in the vagina. This allows your health care provider to see the lower part of the uterus (cervix).  · The cervix is prepped with a medicine that lowers the risk of infection.  · You may be given a medicine to numb each side of the cervix (intracervical or paracervical block). This is used to block and control any discomfort with insertion.  · A tool (  uterine sound) is inserted into the uterus to determine the length of the uterine cavity and the direction the uterus may be tilted.  · A slim instrument (IUD inserter) is inserted through the cervical canal and into your uterus.  · The IUD is placed in the uterine cavity and the insertion device is removed.  · The nylon string that is attached to the IUD and used for eventual IUD removal is trimmed. It is trimmed so that it lays high in the vagina, just outside the cervix.  AFTER THE PROCEDURE  · You may have bleeding after the procedure. This is normal. It varies from light spotting for a few days to menstrual-like  bleeding.   · You may have mild cramping.  Document Released: 01/03/2011 Document Revised: 02/25/2013 Document Reviewed: 10/26/2012  [employer]® Patient Information ©2014 [employer].

## 2013-06-16 NOTE — Progress Notes (Signed)
Patient presents for Mirena IUD removal and replacement. She has read through the booklet, has no contraindications and signed the consent form.  I reviewed the insertional process with her as well as the risks to include infection, either immediate or long-term, uterine perforation or migration requiring surgery to remove, other complications such as pain, hormonal side effects  and possibility of failure with subsequent pregnancy.   Exam with Kim assistant Pelvic: External BUS vagina normal. Cervix normal IUD string visualized at external os. Uterus anteverted to axial normal size shape contour midline mobile nontender. Adnexa without masses or tenderness.  Procedure: The cervix was visualized and the IUD string was grasped with a [place] forcep and the old Mirena IUD was removed, shown to the patient and discarded. The cervix was then cleansed with Betadine, anterior lip grasped with a single-tooth tenaculum, the uterus was sounded and a Mirena IUD was placed according to manufacturer's recommendations without difficulty. The strings were trimmed. The patient tolerated well and will follow up in one month for a postinsertional check.  Lot number:  [id number]  Note: This document was prepared with digital dictation and possible smart phrase technology. Any transcriptional errors that result from this process are unintentional.  Dara LordsFONTAINE,TIMOTHY P MD, 9:28 AM 06/16/2013

## 2013-07-16 ENCOUNTER — Ambulatory Visit: Payer: Managed Care, Other (non HMO) | Admitting: Gynecology

## 2013-11-12 ENCOUNTER — Ambulatory Visit: Payer: Managed Care, Other (non HMO) | Admitting: Gynecology

## 2013-12-21 ENCOUNTER — Encounter: Payer: Managed Care, Other (non HMO) | Admitting: Gynecology

## 2014-01-06 ENCOUNTER — Other Ambulatory Visit: Payer: Self-pay | Admitting: Gynecology

## 2014-02-17 ENCOUNTER — Ambulatory Visit (INDEPENDENT_AMBULATORY_CARE_PROVIDER_SITE_OTHER): Payer: Managed Care, Other (non HMO) | Admitting: Gynecology

## 2014-02-17 ENCOUNTER — Encounter: Payer: Self-pay | Admitting: Gynecology

## 2014-02-17 VITALS — BP 122/76 | Ht 67.5 in | Wt 237.0 lb

## 2014-02-17 DIAGNOSIS — B009 Herpesviral infection, unspecified: Secondary | ICD-10-CM

## 2014-02-17 DIAGNOSIS — A609 Anogenital herpesviral infection, unspecified: Secondary | ICD-10-CM

## 2014-02-17 DIAGNOSIS — Z30431 Encounter for routine checking of intrauterine contraceptive device: Secondary | ICD-10-CM

## 2014-02-17 DIAGNOSIS — Z01419 Encounter for gynecological examination (general) (routine) without abnormal findings: Secondary | ICD-10-CM

## 2014-02-17 DIAGNOSIS — Z113 Encounter for screening for infections with a predominantly sexual mode of transmission: Secondary | ICD-10-CM

## 2014-02-17 MED ORDER — VALACYCLOVIR HCL 500 MG PO TABS: ORAL_TABLET | ORAL | Status: AC

## 2014-02-17 NOTE — Progress Notes (Signed)
Jaclyn Alvin CritchleyM [name] 06/07/1980 [id number]        33 y.o.  [id number] for annual exam.  Doing well without complaints.  Past medical history,surgical history, problem list, medications, allergies, family history and social history were all reviewed and documented as reviewed in the EPIC chart.  ROS:  12 system ROS performed with pertinent positives and negatives included in the history, assessment and plan.   Additional significant findings :  none   Exam: [facility] personassistant Filed Vitals:   02/17/14 1611  BP: 122/76  Height: 5' 7.5" (1.715 m)  Weight: 237 lb (107.502 kg)   General appearance:  Normal affect, orientation and appearance. Skin: Grossly normal HEENT: Without gross lesions.  No cervical or supraclavicular adenopathy. Thyroid normal.  Lungs:  Clear without wheezing, rales or rhonchi Cardiac: RR, without RMG Abdominal:  Soft, nontender, without masses, guarding, rebound, organomegaly or hernia Breasts:  Examined lying and sitting without masses, retractions, discharge or axillary adenopathy. Pelvic:  Ext/BUS/vagina normal  Cervix normal. GC/Chlamydia.   IUD strings visualized  Uterus anteverted, normal size, shape and contour, midline and mobile nontender   Adnexa  Without masses or tenderness    Anus and perineum  Normal   Rectovaginal  Normal sphincter tone without palpated masses or tenderness.    Assessment/Plan:  33 y.o. [id number] female for annual exam with regular light menses, Mirena IUD.   1. Mirena IUD 05/2013. Regular light menses. IUD strings visualized. Continue to monitor. 2. Pap smear 2013. No Pap smear done today. No history of abnormal Pap smears previously. Plan repeat Pap smear next year 3 her interval. 3. Breast self. Mammography 2013 in workup of mastalgia. Doing well now without significant discomfort. Exam is normal. Plan repeat mammography closer to 38. SBE monthly reviewed. 4. STD screening. Patient requests STD screening. No known exposure but wants to be  screened. GC/Chlamydia, HIV, RPR, hepatitis B, hepatitis C ordered. 5. 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 Doing well without outbreaks. Wants to continue. Refill x1 year provided. 6. Stop smoking. 7. Health maintenance. No routine blood work done as she reports this done at her primary physician's office. Follow up for STD results otherwise one year, sooner as needed.     Dara LordsFONTAINE,TIMOTHY P MD, 4:38 PM 02/17/2014

## 2014-02-17 NOTE — Patient Instructions (Signed)
You may obtain a copy of any labs that were done today by logging onto MyChart as outlined in the instructions provided with your AVS (after visit summary). The office will not call with normal lab results but certainly if there are any significant abnormalities then we will contact you.   Health Maintenance, Female A healthy lifestyle and preventative care can promote health and wellness.  Maintain regular health, dental, and eye exams.  Eat a healthy diet. Foods like vegetables, fruits, whole grains, low-fat dairy products, and lean protein foods contain the nutrients you need without too many calories. Decrease your intake of foods high in solid fats, added sugars, and salt. Get information about a proper diet from your caregiver, if necessary.  Regular physical exercise is one of the most important things you can do for your health. Most adults should get at least 150 minutes of moderate-intensity exercise (any activity that increases your heart rate and causes you to sweat) each week. In addition, most adults need muscle-strengthening exercises on 2 or more days a week.   Maintain a healthy weight. The body mass index (BMI) is a screening tool to identify possible weight problems. It provides an estimate of body fat based on height and weight. Your caregiver can help determine your BMI, and can help you achieve or maintain a healthy weight. For adults 20 years and older:  A BMI below 18.5 is considered underweight.  A BMI of 18.5 to 24.9 is normal.  A BMI of 25 to 29.9 is considered overweight.  A BMI of 30 and above is considered obese.  Maintain normal blood lipids and cholesterol by exercising and minimizing your intake of saturated fat. Eat a balanced diet with plenty of fruits and vegetables. Blood tests for lipids and cholesterol should begin at age 61 and be repeated every 5 years. If your lipid or cholesterol levels are high, you are over 50, or you are a high risk for heart  disease, you may need your cholesterol levels checked more frequently.Ongoing high lipid and cholesterol levels should be treated with medicines if diet and exercise are not effective.  If you smoke, find out from your caregiver how to quit. If you do not use tobacco, do not start.  Lung cancer screening is recommended for adults aged 33 80 years who are at high risk for developing lung cancer because of a history of smoking. Yearly low-dose computed tomography (CT) is recommended for people who have at least a 30-pack-year history of smoking and are a current smoker or have quit within the past 15 years. A pack year of smoking is smoking an average of 1 pack of cigarettes a day for 1 year (for example: 1 pack a day for 30 years or 2 packs a day for 15 years). Yearly screening should continue until the smoker has stopped smoking for at least 15 years. Yearly screening should also be stopped for people who develop a health problem that would prevent them from having lung cancer treatment.  If you are pregnant, do not drink alcohol. If you are breastfeeding, be very cautious about drinking alcohol. If you are not pregnant and choose to drink alcohol, do not exceed 1 drink per day. One drink is considered to be 12 ounces (355 mL) of beer, 5 ounces (148 mL) of wine, or 1.5 ounces (44 mL) of liquor.  Avoid use of street drugs. Do not share needles with anyone. Ask for help if you need support or instructions about stopping  the use of drugs.  High blood pressure causes heart disease and increases the risk of stroke. Blood pressure should be checked at least every 1 to 2 years. Ongoing high blood pressure should be treated with medicines, if weight loss and exercise are not effective.  If you are 59 to 35 years old, ask your caregiver if you should take aspirin to prevent strokes.  Diabetes screening involves taking a blood sample to check your fasting blood sugar level. This should be done once every 3  years, after age 91, if you are within normal weight and without risk factors for diabetes. Testing should be considered at a younger age or be carried out more frequently if you are overweight and have at least 1 risk factor for diabetes.  Breast cancer screening is essential preventative care for women. You should practice "breast self-awareness." This means understanding the normal appearance and feel of your breasts and may include breast self-examination. Any changes detected, no matter how small, should be reported to a caregiver. Women in their 66s and 30s should have a clinical breast exam (CBE) by a caregiver as part of a regular health exam every 1 to 3 years. After age 101, women should have a CBE every year. Starting at age 100, women should consider having a mammogram (breast X-ray) every year. Women who have a family history of breast cancer should talk to their caregiver about genetic screening. Women at a high risk of breast cancer should talk to their caregiver about having an MRI and a mammogram every year.  Breast cancer gene (BRCA)-related cancer risk assessment is recommended for women who have family members with BRCA-related cancers. BRCA-related cancers include breast, ovarian, tubal, and peritoneal cancers. Having family members with these cancers may be associated with an increased risk for harmful changes (mutations) in the breast cancer genes BRCA1 and BRCA2. Results of the assessment will determine the need for genetic counseling and BRCA1 and BRCA2 testing.  The Pap test is a screening test for cervical cancer. Women should have a Pap test starting at age 57. Between ages 25 and 35, Pap tests should be repeated every 2 years. Beginning at age 37, you should have a Pap test every 3 years as long as the past 3 Pap tests have been normal. If you had a hysterectomy for a problem that was not cancer or a condition that could lead to cancer, then you no longer need Pap tests. If you are  between ages 50 and 76, and you have had normal Pap tests going back 10 years, you no longer need Pap tests. If you have had past treatment for cervical cancer or a condition that could lead to cancer, you need Pap tests and screening for cancer for at least 20 years after your treatment. If Pap tests have been discontinued, risk factors (such as a new sexual partner) need to be reassessed to determine if screening should be resumed. Some women have medical problems that increase the chance of getting cervical cancer. In these cases, your caregiver may recommend more frequent screening and Pap tests.  The human papillomavirus (HPV) test is an additional test that may be used for cervical cancer screening. The HPV test looks for the virus that can cause the cell changes on the cervix. The cells collected during the Pap test can be tested for HPV. The HPV test could be used to screen women aged 44 years and older, and should be used in women of any age  who have unclear Pap test results. After the age of 55, women should have HPV testing at the same frequency as a Pap test.  Colorectal cancer can be detected and often prevented. Most routine colorectal cancer screening begins at the age of 44 and continues through age 20. However, your caregiver may recommend screening at an earlier age if you have risk factors for colon cancer. On a yearly basis, your caregiver may provide home test kits to check for hidden blood in the stool. Use of a small camera at the end of a tube, to directly examine the colon (sigmoidoscopy or colonoscopy), can detect the earliest forms of colorectal cancer. Talk to your caregiver about this at age 86, when routine screening begins. Direct examination of the colon should be repeated every 5 to 10 years through age 13, unless early forms of pre-cancerous polyps or small growths are found.  Hepatitis C blood testing is recommended for all people born from [birth year] through 1967 and any  individual with known risks for hepatitis C.  Practice safe sex. Use condoms and avoid high-risk sexual practices to reduce the spread of sexually transmitted infections (STIs). Sexually active women aged 36 and younger should be checked for Chlamydia, which is a common sexually transmitted infection. Older women with new or multiple partners should also be tested for Chlamydia. Testing for other STIs is recommended if you are sexually active and at increased risk.  Osteoporosis is a disease in which the bones lose minerals and strength with aging. This can result in serious bone fractures. The risk of osteoporosis can be identified using a bone density scan. Women ages 20 and over and women at risk for fractures or osteoporosis should discuss screening with their caregivers. Ask your caregiver whether you should be taking a calcium supplement or vitamin D to reduce the rate of osteoporosis.  Menopause can be associated with physical symptoms and risks. Hormone replacement therapy is available to decrease symptoms and risks. You should talk to your caregiver about whether hormone replacement therapy is right for you.  Use sunscreen. Apply sunscreen liberally and repeatedly throughout the day. You should seek shade when your shadow is shorter than you. Protect yourself by wearing long sleeves, pants, a wide-brimmed hat, and sunglasses year round, whenever you are outdoors.  Notify your caregiver of new moles or changes in moles, especially if there is a change in shape or color. Also notify your caregiver if a mole is larger than the size of a pencil eraser.  Stay current with your immunizations. Document Released: 11/20/2010 Document Revised: 09/01/2012 Document Reviewed: 11/20/2010 [facility] Patient Information 2014 [employer].

## 2014-02-18 LAB — GC/CHLAMYDIA PROBE AMP
CT Probe RNA: NEGATIVE
GC Probe RNA: NEGATIVE

## 2014-02-18 LAB — HEPATITIS C ANTIBODY: HCV Ab: NEGATIVE

## 2014-02-18 LAB — RPR

## 2014-02-18 LAB — HEPATITIS B SURFACE ANTIGEN: HEP B S AG: NEGATIVE

## 2014-02-18 LAB — HIV ANTIBODY (ROUTINE TESTING W REFLEX): HIV 1&2 Ab, 4th Generation: NONREACTIVE

## 2014-03-22 ENCOUNTER — Encounter: Payer: Self-pay | Admitting: Gynecology

## 2015-02-21 ENCOUNTER — Encounter: Payer: Managed Care, Other (non HMO) | Admitting: Gynecology

## 2015-02-28 ENCOUNTER — Ambulatory Visit (INDEPENDENT_AMBULATORY_CARE_PROVIDER_SITE_OTHER): Payer: BLUE CROSS/BLUE SHIELD | Admitting: Gynecology

## 2015-02-28 ENCOUNTER — Encounter: Payer: Self-pay | Admitting: Gynecology

## 2015-02-28 ENCOUNTER — Other Ambulatory Visit (HOSPITAL_COMMUNITY)
Admission: RE | Admit: 2015-02-28 | Discharge: 2015-02-28 | Disposition: A | Discharge status: Home / Self Care | Payer: BLUE CROSS/BLUE SHIELD | Source: Ambulatory Visit | Attending: Gynecology | Admitting: Gynecology

## 2015-02-28 VITALS — BP 120/80 | Ht 68.0 in | Wt 252.6 lb

## 2015-02-28 DIAGNOSIS — Z30431 Encounter for routine checking of intrauterine contraceptive device: Secondary | ICD-10-CM | POA: Diagnosis not present

## 2015-02-28 DIAGNOSIS — Z113 Encounter for screening for infections with a predominantly sexual mode of transmission: Secondary | ICD-10-CM | POA: Diagnosis not present

## 2015-02-28 DIAGNOSIS — Z01419 Encounter for gynecological examination (general) (routine) without abnormal findings: Secondary | ICD-10-CM

## 2015-02-28 LAB — HEPATITIS B SURFACE ANTIGEN: Hepatitis B Surface Ag: NEGATIVE

## 2015-02-28 LAB — HEPATITIS C ANTIBODY: HCV Ab: NEGATIVE

## 2015-02-28 NOTE — Patient Instructions (Signed)

## 2015-02-28 NOTE — Progress Notes (Signed)
[name] Oct 17, 1980 [id number]        34 y.o.  [id number] for annual exam.  Several issues noted below.  Past medical history,surgical history, problem list, medications, allergies, family history and social history were all reviewed and documented as reviewed in the EPIC chart.  ROS:  Performed with pertinent positives and negatives included in the history, assessment and plan.   Additional significant findings :   None   Exam: [name] Vitals:   02/28/15 1050  BP: 120/80  Height:  (1.727 m)  Weight: 252 lb 9.6 oz (114.579 kg)   General appearance:  Normal affect, orientation and appearance. Skin: Grossly normal HEENT: Without gross lesions.  No cervical or supraclavicular adenopathy. Thyroid normal.  Lungs:  Clear without wheezing, rales or rhonchi Cardiac: RR, without RMG Abdominal:  Soft, nontender, without masses, guarding, rebound, organomegaly or hernia Breasts:  Examined lying and sitting without masses, retractions, discharge or axillary adenopathy. Pelvic:  Ext/BUS/vagina normal  Cervix normal. IUD strings visualized. Pap smear, GC/chlamydia done  Uterus anteverted, normal size, shape and contour, midline and mobile nontender   Adnexa  Without masses or tenderness    Anus and perineum  Normal   Rectovaginal  Normal sphincter tone without palpated masses or tenderness.    Assessment/Plan:  34 y.o. [id number] female for annual exam with light to absent menses, Mirena IUD.   1. Mirena IUD 05/2013. Doing well with scant to absent menses. Patient wanted to discuss having her IUD removed to give her body a rest. I reviewed the issues of no real need to do this. The need to use alternative birth control she decides to do this also discussed. She continues to smoke and I reviewed the contraindication for oral contraceptives.  Nexplanon/Depo-Provera options reviewed and rejected. Sterilization both tubal and vasectomy reviewed.  At this point patient desires to  continue with her Mirena IUD. Will follow up if she has any other questions. 2. STD screening requested. No known exposure but wants to be screened. GC/Chlamydia, hepatitis B, hepatitis C, RPR, HIV ordered. 3. Pap smear 2013. Pap smear done today. No history of abnormal Pap smears previously. 4. Breast health. SBE monthly reviewed. Screening mammographic recommendations between 35 and 40 reviewed. Patient will decide if she wants to proceed now with mammography or wait closer to 40 as she does not have a strong family history of breast cancer noting an elderly maternal grandmother. 5. History of HSV with daily Valtrex 500 mg suppression. Doing well and wants to continue. Will call when needs refill. 6. Health maintenance. No routine blood work done and she reports this done at her primary physician's office. Follow up 1 year, sooner as needed.   [name] MD, 11:05 AM 02/28/2015

## 2015-02-28 NOTE — Addendum Note (Signed)
Addended by: Richardson Chiquito on: 02/28/2015 11:19 AM   Modules accepted: Orders

## 2015-03-01 LAB — CYTOLOGY - PAP

## 2015-03-01 LAB — GC/CHLAMYDIA PROBE AMP
CT Probe RNA: NEGATIVE
GC PROBE AMP APTIMA: NEGATIVE

## 2015-03-01 LAB — HIV ANTIBODY (ROUTINE TESTING W REFLEX): HIV: NONREACTIVE

## 2015-03-01 LAB — RPR

## 2016-03-02 ENCOUNTER — Encounter: Payer: BLUE CROSS/BLUE SHIELD | Admitting: Gynecology

## 2019-02-10 ENCOUNTER — Encounter: Payer: Self-pay | Admitting: Gynecology

## 2020-04-20 ENCOUNTER — Inpatient Hospital Stay
Admit: 2020-04-20 | Payer: BLUE CROSS/BLUE SHIELD | Primary: Student in an Organized Health Care Education/Training Program

## 2020-04-20 DIAGNOSIS — M25562 Pain in left knee: Secondary | ICD-10-CM

## 2020-04-20 NOTE — Progress Notes (Signed)
PHYSICAL THERAPY - DAILY TREATMENT NOTE    Patient Name: Abigail Becker        Date: 04/20/2020  DOB: [birth year]   yes Patient DOB Verified  Visit #:   1   of   12  Insurance: Payor: BLUE CROSS / Plan: [employer] / Product Type: HMO /      In time: 610 pm Out time: 647 pm   Total Treatment Time: 37     Medicare/BCBS Time Tracking (below)   Total Timed Codes (min):  15 1:1 Treatment Time:  15     TREATMENT AREA =  Left knee pain [M25.562]  Unilateral primary osteoarthritis, left knee [M17.12]  Infection of knee (HCC) [M00.9]    SUBJECTIVE  Pain Level (on 0 to 10 scale):  1-2 / 10   Medication Changes/New allergies or changes in medical history, any new surgeries or procedures?    no  If yes, update Summary List   Subjective Functional Status/Changes:  []   No changes reported     See POC       OBJECTIVE  15 min Self Care: See pt education   Rationale:    Pt education to improve the patient's ability to adhere to PT POC    Billed With/As:  []  TE  []  TA  []  Neuro  [x]  Self Care Patient Education: [x]  Review HEP    []  Progressed/Changed HEP based on:   []  positioning   []  body mechanics   []  transfers   []  heat/ice application    [x]  other: PT diagnosis, prognosis, POC     Other Objective/Functional Measures:    See POC     Post Treatment Pain Level (on 0 to 10) scale:   0  / 10     ASSESSMENT  Assessment/Changes in Function:     Justification for Eval Code Complexity:  Patient History: DMT2, arthritis, chronicity of symptoms, HTN, tobacco use  Examination: See exam  Clinical Presentation: evolving  Clinical Decision Making: MEDIUM  FOTO: 19 /100     []   See Progress Note/Recertification   Patient will continue to benefit from skilled PT services to modify and progress therapeutic interventions, address functional mobility deficits, address ROM deficits, address strength deficits, analyze and address soft tissue restrictions, analyze and cue movement patterns, analyze and modify body mechanics/ergonomics, assess and  modify postural abnormalities, address imbalance/dizziness and instruct in home and community integration to attain remaining goals.   Progress toward goals / Updated goals:    See POC     PLAN  [x]   Upgrade activities as tolerated yes Continue plan of care   []   Discharge due to :    []   Other:      Therapist: , PT    Date: 04/20/2020 Time: 6:00 PM     Future Appointments   Date Time Provider Department Center   04/26/2020  5:15 PM , PT [facility]   05/09/2020  5:15 PM [facility]   05/11/2020  5:15 PM [facility]   05/16/2020  5:15 PM , [facility]   05/18/2020  5:15 PM [facility]

## 2020-04-20 NOTE — Progress Notes (Signed)
 [facility]  [address],[place]  [place], [place] [address] - Phone: [phone]  Fax: [phone]  PLAN OF CARE / STATEMENT OF MEDICAL NECESSITY FOR PHYSICAL THERAPY SERVICES  Patient Name: Abigail Becker DOB: 09-Jan-1981   Medical Diagnosis: Left knee pain [M25.562]  Unilateral primary osteoarthritis, left knee [M17.12]  Infection of knee (HCC) [M00.9] Treatment Diagnosis: Left knee pain [M25.562]   Onset Date: 03/13/20     Referral Source: Lang Jayson SQUIBB, MD Start of Care [facility]): 04/20/2020   Prior Hospitalization: See medical history Provider #: [phone]   Prior Level of Function: Chronic bilat knee issues   Comorbidities: DMT2, arthritis, chronicity of symptoms, HTN, tobacco use   Medications: Verified on Patient Summary List   The Plan of Care and following information is based on the information from the initial evaluation.   ===========================================================================================  Assessment / key information:  Patient is 39 y.o. female who presents to [facility] with diagnosis of Left knee pain [M25.562]  Unilateral primary osteoarthritis, left knee [M17.12]  Infection of knee (HCC) [M00.9].  Pt presents s/p L knee arthroscopy/debridement 03/03/20.  Pt had HHPT services prior to beginning outpatient services, has since been DC'd.  Pt has a RW and bilat AC, but only uses unilat AC in RUE for community negotiation.  Objective data detailed below.  Patient scored 19 on FOTO indicating decreased function and quality of life.  Pt would benefit from skilled PT services to address impairments, work towards goals, and return to PLOF.    Pain: current 1-2/10, at worst 8-9/10, at best 0/10  Aggravating factors: prolonged standing, stepping a certain way (unable to describe), stairs ascent, pivoting, prolonged sitting, sidelying  Alleviating factors: rest, ice, HEP from HHPT (quad sets, heel slides, etc),  sleeping with body pillow between legs  Pain description: stiffness  Pain location: ant medial knee jt into superior medial patella    Hip MMT: flex R 5 L 5, ext R 5 L 5, ABD R 5 L 5, IR R 5 L 5, ER R 5 L 4-  Knee AROM(PROM): flex R 120 L 110 ext R 0 L -10 LAQ 0 with prop  Knee MMT: flex R 5 L 5, ext R 5 L 3-  Ankle MMT: grossly WNL except PF 2+  Sit-to-stand: dec ant weight shift, bilat UE used to push up  Ambulation: step through patterning with unilat AC on R  Stairs negotiation: step-to negotiation with HR as needed  SLS: R 30 seconds L 19 seconds with mid guard positioning and inc ankle strategy  Incision: closed, no signs of infection  ===========================================================================================  Eval Complexity: History HIGH Complexity :3+ comorbidities / personal factors will impact the outcome/ POC ;  Examination  MEDIUM Complexity : 3 Standardized tests and measures addressing body structure, function, activity limitation and / or participation in recreation ; Presentation MEDIUM Complexity : Evolving with changing characteristics ;  Decision Making HIGH Complexity : FOTO score of 1- 25 ; Overall Complexity MEDIUM  Problem List: pain affecting function, decrease ROM, decrease strength, edema affecting function, impaired gait/ balance, decrease ADL/ functional abilitiies, decrease activity tolerance, decrease flexibility/ joint mobility and decrease transfer abilities   Treatment Plan may include any combination of the following: Therapeutic exercise, Therapeutic activities, Neuromuscular re-education, Physical agent/modality, Gait/balance training, Manual therapy, Patient education, Self Care training, Functional mobility training, Home safety training and Stair training  Patient / Family readiness to learn indicated by: asking  questions, trying to perform skills and interest  Persons(s) to be included in education: patient (P)  Barriers to Learning/Limitations:  None  Measures taken, if barriers to learning:    Patient Goal (s): Strengthen leg/knee, walk without crutch, stand longer, walk without limp, walk completely upright   Patient self reported health status: good  Rehabilitation Potential: good  . Short Term Goals: To be accomplished in 4 weeks:  1) Patient performing daily home exercise program to facilitate appt carryover and POC.  2) Patient to demo L AROM ext to 0 in order to facilitate LE functional tasks, ADLs.  3) Patient to demo FOTO score of 37 indicating improved function and quality of life.  4) Pt to report >=+3 on GROC indicating clinically significant subjective functional improvement.  . Long Term Goals: To be accomplished in 6 weeks:  1) Patient Independent with progressive HEP to facilitate symptom management and progression upon DC.  2) Patient to demo FOTO score of 55 indicating improved function and quality of life.  3) Patient to demo L knee ext, hip er MMT >=4+ so patient has improved ease of LE functional tasks, ADLs.  4) Pt to report >=+5 on GROC indicating clinically significant subjective functional improvement.  5) Pt to demo SLS x30 seconds on L without mid/high guard and with ankle stability in order to have improved static balance.  Frequency / Duration: Patient to be seen  2  times per week for 6  weeks:  Patient / Caregiver education and instruction: other PT diagnosis, prognosis, POC  Therapist Signature: Chiquita Alert, PT Date: 04/20/2020   Certification Period: na Time: 6:19 PM   ===========================================================================================  I certify that the above Physical Therapy Services are being furnished while the patient is under my care.  I agree with the treatment plan and certify that this therapy is necessary.    Physician Signature:        Date:       Time:                                        Lang Jayson SQUIBB, MD    Please sign and return to [facility] at [facility] or  you may fax the signed copy to [phone].  Thank you.

## 2020-04-26 ENCOUNTER — Encounter: Payer: BLUE CROSS/BLUE SHIELD | Primary: Student in an Organized Health Care Education/Training Program

## 2020-05-05 ENCOUNTER — Inpatient Hospital Stay
Admit: 2020-05-05 | Payer: BLUE CROSS/BLUE SHIELD | Primary: Student in an Organized Health Care Education/Training Program

## 2020-05-05 NOTE — Progress Notes (Signed)
PHYSICAL THERAPY - DAILY TREATMENT NOTE    Patient Name: Abigail Becker        Date: 05/05/2020  DOB: Mar 14, 1981   yes Patient DOB Verified  Visit #:   2   of   12  Insurance: Payor: BLUE CROSS / Plan: [employer] / Product Type: HMO /      In time: 5:04 Out time: 6:07   Total Treatment Time: 63     Medicare/BCBS Time Tracking (below)   Total Timed Codes (min):  53 1:1 Treatment Time:  53     TREATMENT AREA =  Left knee pain [M25.562]  Unilateral primary osteoarthritis, left knee [M17.12]  Infection of knee (HCC) [M00.9]    SUBJECTIVE  Pain Level (on 0 to 10 scale):  0 / 10   Medication Changes/New allergies or changes in medical history, any new surgeries or procedures?    no  If yes, update Summary List   Subjective Functional Status/Changes:  []   No changes reported     Patient currently reports no pain, but will feel pain when she steps the wrong way or pivots. Denies any falls or red flags since LV. States that she feels comfortable reciprocally negotiating stairs with handrail.  MD F/U appt on Monday 12/20       Modalities Rationale:     decrease inflammation and decrease pain to improve patient's c/o muscle soreness.    min []  Estim, type/location:                                      []   att     []   unatt     []   w/US     []   w/ice    []   w/heat    min []   Mechanical Traction: type/lbs                   []   pro   []   sup   []   int   []   cont    []   before manual    []   after manual    min []   Ultrasound, settings/location:      min []   Iontophoresis w/ dexamethasone, location:                                               []   take home patch       []   in clinic   10 min [x]   Ice     []   Heat    location/position: To L knee in semirecline with LE on wedge post-session    min []   Vasopneumatic Device, press/temp:    If using vaso (only need to measure limb vaso being performed on)      pre-treatment girth :       post-treatment girth :       measured at (landmark location) :      min []   Other:    [x]  Skin  assessment post-treatment (if applicable):    [x]   intact    []   redness- no adverse reaction                  [] redness - adverse reaction:        43 min Therapeutic  Exercise:  [x]   See flow sheet   Rationale:      increase ROM and increase strength to improve the patient's ability to complete stair and curb negotiation.    10 min Self Care: Reassessment. Issued and reviewed initial HEP. Reviewed current diagnosis, prognosis, and POC.    Rationale:  to improve understanding of current diagnosis with realistic expectation of PT to improve compliance/adherence and satisfaction.    Billed With/As:   [x]  TE   []  TA   []  Neuro   []  Self Care Patient Education: [x]  Review HEP:   MedBridge Access Code Date Helaine Chess   [facility] 05/05/20     [x]  Progressed/Changed HEP based on:   []  Addressed barriers and behaviors     []  Therapeutic Neuroscience Pain Education, metaphor, reframing, contexts.    []  Sleep Education   []  Body Mechanics []  Healing Timeframe     []  Self STM with ball at "the spot"  []  Walking Program/Global Activity   []  other:        Other Objective/Functional Measures:    *arrived to PT session with SPC on R side  *initiated exercises per POC (see flowsheet) to improve L LE mobility, flexibility, strength, and stability for functional ADLs. Req'd cues 100% of exercises for proper form/technique due to 1st F/U appt.   *medial joint line pain with eccentric phase of TB TKE      Post Treatment Pain Level (on 0 to 10) scale:   0  / 10     ASSESSMENT  Assessment/Changes in Function:     Patient noted no pain following PT session. Educated pt on likelihood of DOMS, noting this is a normal response and utilizing ice to reduce symptoms. Patient acknowledged understanding.      []   See Progress Note/Recertification   Patient will continue to benefit from skilled PT services to modify and progress therapeutic interventions, address functional mobility deficits, address ROM deficits, address strength deficits, analyze and  address soft tissue restrictions, analyze and cue movement patterns, analyze and modify body mechanics/ergonomics, assess and modify postural abnormalities, address imbalance/dizziness and instruct in home and community integration to attain remaining goals.   Progress toward goals / Updated goals:     Short Term Goals: To be accomplished in 4 weeks since initial eval on 12/01:  1) Patient performing daily home exercise program to facilitate appt carryover and POC. Progressing 12/16 HEP issued   2) Patient to demo L AROM ext to 0 in order to facilitate LE functional tasks, ADLs.  3) Patient to demo FOTO score of 37 indicating improved function and quality of life.  4) Pt to report >=+3 on GROC indicating clinically significant subjective functional improvement.    Long Term Goals: To be accomplished in 6 weeks since initial eval on 12/01:  1) Patient Independent with progressive HEP to facilitate symptom management and progression upon DC.  2) Patient to demo FOTO score of 55 indicating improved function and quality of life.  3) Patient to demo L knee ext, hip er MMT >=4+ so patient has improved ease of LE functional tasks, ADLs.  4) Pt to report >=+5 on GROC indicating clinically significant subjective functional improvement.  5) Pt to demo SLS x30 seconds on L without mid/high guard and with ankle stability in order to have improved static balance.     PLAN  [x]   Upgrade activities as tolerated yes Continue plan of care   []   Discharge due to :    []   Other:      Therapist: Yehuda Savannah    Date: 05/05/2020 Time: 6:45 PM     Future Appointments   Date Time Provider Department Center   05/05/2020  5:00 PM [facility]   05/09/2020  5:15 PM [facility]   05/11/2020  5:15 PM [facility] Burke [facility]   05/16/2020  5:15 PM Donalynn Furlong, PT [facility]   05/18/2020  5:15 PM [facility]

## 2020-05-09 ENCOUNTER — Encounter: Payer: BLUE CROSS/BLUE SHIELD | Primary: Student in an Organized Health Care Education/Training Program

## 2020-05-11 ENCOUNTER — Inpatient Hospital Stay
Admit: 2020-05-11 | Payer: BLUE CROSS/BLUE SHIELD | Primary: Student in an Organized Health Care Education/Training Program

## 2020-05-11 NOTE — Progress Notes (Signed)
 PHYSICAL THERAPY - DAILY TREATMENT NOTE    Patient Name: Abigail Becker        Date: 05/11/2020  DOB: [birth year]   yes Patient DOB Verified  Visit #:   3   of   12  Insurance: Payor: BLUE CROSS / Plan: [employer] / Product Type: HMO /      In time: 5:20 Out time: *6:19   Total Treatment Time: 59     Medicare/BCBS Time Tracking (below)   Total Timed Codes (min):  49 1:1 Treatment Time:  49     TREATMENT AREA =  Left knee pain [M25.562]  Unilateral primary osteoarthritis, left knee [M17.12]  Infection of knee (HCC) [M00.9]    SUBJECTIVE  Pain Level (on 0 to 10 scale):  0 / 10   Medication Changes/New allergies or changes in medical history, any new surgeries or procedures?    no  If yes, update Summary List   Subjective Functional Status/Changes:  []   No changes reported     Patient reports seeing MD after LV, MD stated that everything looked good. Next follow up 06/10/20. Reports that she has been doing her HEP        Modalities Rationale:     decrease inflammation and decrease pain to improve patient's c/o muscle soreness.    min []  Estim, type/location:                                      []   att     []   unatt     []   w/US      []   w/ice    []   w/heat    min []   Mechanical Traction: type/lbs                   []   pro   []   sup   []   int   []   cont    []   before manual    []   after manual    min []   Ultrasound, settings/location:      min []   Iontophoresis w/ dexamethasone, location:                                               []   take home patch       []   in clinic   10 min [x]   Ice     []   Heat    location/position: To L knee in semirecline with LE on wedge post-session    min []   Vasopneumatic Device, press/temp:    If using vaso (only need to measure limb vaso being performed on)      pre-treatment girth :       post-treatment girth :       measured at (landmark location) :      min []   Other:    [x]  Skin assessment post-treatment (if applicable):    [x]   intact    []   redness- no adverse reaction                   [] redness - adverse reaction:        41 min Therapeutic Exercise:  [x]   See flow sheet; (2 TE billed)    Rationale:  increase ROM and increase strength to improve the patient's ability to complete stair and curb negotiation safely.     8 min Manual Therapy: Deep pressure to L instrinsic foot muscles along MTPs. L GT and lesser toes PROM and stretch flex/ext   Rationale:      decrease pain, increase ROM and increase tissue extensibility to improve patient's ability to ambulate.  The manual therapy interventions were performed at a separate and distinct time from the therapeutic activities interventions.      Billed With/As:   [x]  TE   []  TA   []  Neuro   []  Self Care Patient Education: [x]  Review HEP:   MedBridge Access Code Date Arletta   [facility] 05/05/20     [x]  Progressed/Changed HEP based on:   []  Addressed barriers and behaviors     []  Therapeutic Neuroscience Pain Education, metaphor, reframing, contexts.    []  Sleep Education   []  Body Mechanics []  Healing Timeframe     []  Self STM with ball at the spot  []  Walking Program/Global Activity   []  other:        Other Objective/Functional Measures:    *progressed exercises to increase quadriceps strength and challenge knee stability (see flowsheet for loads, drills, & volumes)   *demo lateral trunk sway and significant ankle/foot balance strategy during SLS.     Post Treatment Pain Level (on 0 to 10) scale:   0  / 10     ASSESSMENT  Assessment/Changes in Function:     Performed periodic rest breaks throughout exercises (standing hip/knee flex) to reduce fatigue. Pt demo fair form with exercises, min cues required for eccentric control. Completed exercises and utilized ice to L knee. Pt began to stand up and noticing severe cramping in the L plantar aspect of the foot. Performed manual to alleviate symptoms. Symptoms could be very likely due to increase use during SLS. Advised pt to avoid performing SLS without UE to reduce this and prevent falls at  home. Patient acknowledged understanding.      []   See Progress Note/Recertification   Patient will continue to benefit from skilled PT services to modify and progress therapeutic interventions, address functional mobility deficits, address ROM deficits, address strength deficits, analyze and address soft tissue restrictions, analyze and cue movement patterns, analyze and modify body mechanics/ergonomics, assess and modify postural abnormalities, address imbalance/dizziness and instruct in home and community integration to attain remaining goals.   Progress toward goals / Updated goals:     Short Term Goals: To be accomplished in 4 weeks since initial eval on 12/01:  1) Patient performing daily home exercise program to facilitate appt carryover and POC. Progressing 12/16 HEP issued   2) Patient to demo L AROM ext to 0 in order to facilitate LE functional tasks, ADLs. Met 12/22  3) Patient to demo FOTO score of 37 indicating improved function and quality of life.  4) Pt to report >=+3 on GROC indicating clinically significant subjective functional improvement.    Long Term Goals: To be accomplished in 6 weeks since initial eval on 12/01:  1) Patient Independent with progressive HEP to facilitate symptom management and progression upon DC.  2) Patient to demo FOTO score of 55 indicating improved function and quality of life.  3) Patient to demo L knee ext, hip er MMT >=4+ so patient has improved ease of LE functional tasks, ADLs. Progressing 12/22 indicated by ability to complete PREs  4) Pt to report >=+5 on GROC indicating clinically  significant subjective functional improvement.  5) Pt to demo SLS x30 seconds on L without mid/high guard and with ankle stability in order to have improved static balance. Initiated without UE 12/22; 5 seconds     PLAN  [x]   Upgrade activities as tolerated yes Continue plan of care   []   Discharge due to :    []   Other:      Therapist: Delene CHRISTELLA Pinal, LPTA    Date: 05/11/2020 Time:  6:46 PM     Future Appointments   Date Time Provider Department Center   05/18/2020  5:15 PM [facility]   05/25/2020  5:15 PM [facility] PT [facility] 3 [facility]   06/01/2020  5:15 PM [place] Delene CHRISTELLA [facility]   06/08/2020  5:15 PM [place] Delene CHRISTELLA [facility]   06/15/2020  5:15 PM [facility]   06/22/2020  5:15 PM [facility]

## 2020-05-16 ENCOUNTER — Encounter: Payer: BLUE CROSS/BLUE SHIELD | Primary: Student in an Organized Health Care Education/Training Program

## 2020-05-18 ENCOUNTER — Inpatient Hospital Stay
Admit: 2020-05-18 | Payer: BLUE CROSS/BLUE SHIELD | Primary: Student in an Organized Health Care Education/Training Program

## 2020-05-18 NOTE — Progress Notes (Signed)
PHYSICAL THERAPY - DAILY TREATMENT NOTE  8-14    Patient Name: Abigail Becker        Date: 05/18/2020  DOB: 1980/11/13   YES Patient DOB Verified  Visit #:   4   of   16  Insurance: Payor: BLUE CROSS / Plan: [employer] / Product Type: HMO /      In time: 5:15 Out time: 5:55   Total Treatment Time: 40     Medicare/BCBS Time Tracking (below)   Total Timed Codes (min):  30 1:1 Treatment Time:  30     TREATMENT AREA =  Left knee pain [M25.562]  Unilateral primary osteoarthritis, left knee [M17.12]  Infection of knee (HCC) [M00.9]    SUBJECTIVE    Pain Level (on 0 to 10 scale):  0  / 10   Medication Changes/New allergies or changes in medical history, any new surgeries or procedures?    NO    If yes, update Summary List   Subjective Functional Status/Changes:  []   No changes reported       Functional improvements: sit to stand, walking without cane  Functional impairments: standing tolerance, balance         OBJECTIVE  Modalities Rationale:     decrease pain to improve patient's ability to negotiate steps   min []  Estim, type/location:                                      []   att     []   unatt     []   w/US     []   w/ice    []   w/heat    min []   Mechanical Traction: type/lbs                   []   pro   []   sup   []   int   []   cont    []   before manual    []   after manual    min []   Ultrasound, settings/location:      min []   Iontophoresis w/ dexamethasone, location:                                               []   take home patch       []   in clinic   10 min [x]   Ice     []   Heat    location/position: Left knee    min []   Vasopneumatic Device, press/temp:        min []   Other:    [x]  Skin assessment post-treatment (if applicable):    []   intact    [x]   redness- no adverse reaction                  [] redness - adverse reaction:      30 min Therapeutic Exercise:  [x]   See flow sheet   Rationale:      increase strength and improve balance to improve the patient's ability to negotiate steps     Billed With/As:   [x]   TE   []  TA   []  Neuro   []  Self Care Patient Education: [x]  Review HEP    []  Progressed/Changed HEP based on:   []  positioning   []   body mechanics   []  transfers   []  heat/ice application    []  other:      Other Objective/Functional Measures:    Good tolerance to therex progression without complaints of increased pain.,     Post Treatment Pain Level (on 0 to 10) scale:   0  / 10     ASSESSMENT    Assessment/Changes in Function:     Patient reporting improved standing tolerance to 15 minutes until fatigue. Continued difficulty with balance and stair negotiation.     []   See Progress Note/Recertification   Patient will continue to benefit from skilled PT services to modify and progress therapeutic interventions, address functional mobility deficits, address ROM deficits, address strength deficits, analyze and address soft tissue restrictions, analyze and cue movement patterns, analyze and modify body mechanics/ergonomics and assess and modify postural abnormalities to attain remaining goals.  to attain remaining goals.   Progress toward goals / Updated goals:    Fair Progress to    []  STG    [x]  LTG  4 as shown by pain free today.     PLAN    [x]   Upgrade activities as tolerated YES Continue plan of care   []   Discharge due to :    []   Other:      Therapist: Debera Lat, PT    Date: 05/18/2020 Time: 5:16 PM     Future Appointments   Date Time Provider Department Center   05/25/2020  5:15 PM [facility] PT [facility] 3 [facility]   06/01/2020  5:15 PM Cle[facility]rice Burke [facility]   06/08/2020  5:15 PM Cle[facility]rice Burke [facility]   06/15/2020  5:15 PM [facility] 3 [facility]   06/22/2020  5:15 PM [facility]

## 2020-05-25 ENCOUNTER — Inpatient Hospital Stay
Admit: 2020-05-25 | Payer: BLUE CROSS/BLUE SHIELD | Primary: Student in an Organized Health Care Education/Training Program

## 2020-05-25 DIAGNOSIS — M25562 Pain in left knee: Secondary | ICD-10-CM

## 2020-05-25 NOTE — Progress Notes (Signed)
 PHYSICAL THERAPY - DAILY TREATMENT NOTE  8-14    Patient Name: Abigail Becker        Date: 05/25/2020  DOB: [birth year]   YES Patient DOB Verified  Visit #:   5   of   16  Insurance: Payor: BLUE CROSS / Plan: [employer] / Product Type: HMO /      In time: 5:20 Out time: 6:10   Total Treatment Time: 50     Medicare/BCBS Time Tracking (below)   Total Timed Codes (min):  40 1:1 Treatment Time:  40     TREATMENT AREA =  Left knee pain [M25.562]  Unilateral primary osteoarthritis, left knee [M17.12]  Infection of knee (HCC) [M00.9]    SUBJECTIVE    Pain Level (on 0 to 10 scale):  0  / 10   Medication Changes/New allergies or changes in medical history, any new surgeries or procedures?    NO    If yes, update Summary List   Subjective Functional Status/Changes:  []   No changes reported       See PN       OBJECTIVE  Modalities Rationale:     decrease pain to improve patient's ability to perform ADLs   min []  Estim, type/location:                                      []   att     []   unatt     []   w/US      []   w/ice    []   w/heat    min []   Mechanical Traction: type/lbs                   []   pro   []   sup   []   int   []   cont    []   before manual    []   after manual    min []   Ultrasound, settings/location:      min []   Iontophoresis w/ dexamethasone, location:                                               []   take home patch       []   in clinic   10 min [x]   Ice     []   Heat    location/position: Supine left knee    min []   Vasopneumatic Device, press/temp:        min []   Other:    [x]  Skin assessment post-treatment (if applicable):    []   intact    [x]   redness- no adverse reaction                  [] redness - adverse reaction:      40 min Therapeutic Exercise:  [x]   See flow sheet   Rationale:      increase ROM and increase strength to improve the patient's ability to negotiate steps     Billed With/As:   [x]  TE   []  TA   []  Neuro   []  Self Care Patient Education: [x]  Review HEP    []  Progressed/Changed HEP based on:    []  positioning   []  body mechanics   []  transfers   []  heat/ice application    []   other:      Other Objective/Functional Measures:    See PN     Post Treatment Pain Level (on 0 to 10) scale:   0  / 10     ASSESSMENT    Assessment/Changes in Function:     See PN     [x]   See Progress Note/Recertification   Patient will continue to benefit from skilled PT services to modify and progress therapeutic interventions, address functional mobility deficits, address ROM deficits, address strength deficits, analyze and address soft tissue restrictions, analyze and cue movement patterns, analyze and modify body mechanics/ergonomics and assess and modify postural abnormalities to attain remaining goals.  to attain remaining goals.   Progress toward goals / Updated goals:    See PN     PLAN    [x]   Upgrade activities as tolerated YES Continue plan of care   []   Discharge due to :    []   Other:      Therapist: Rea Bernhardt, PT    Date: 05/25/2020 Time: 5:20 PM     Future Appointments   Date Time Provider Department Center   06/01/2020  5:15 PM Cleotilde Delene HER[facility]   06/08/2020  5:15 PM Cleotilde Delene HERO [facility]   06/15/2020  5:15 PM [facility]   06/22/2020  5:15 PM [facility]

## 2020-05-25 NOTE — Progress Notes (Signed)
 [facility] - [facility] THERAPY  [address] [place], [place] [address] - Phone: [phone]  Fax: [phone]  PROGRESS NOTE  Patient Name: Abigail Becker DOB: February 16, 1981   Treatment/Medical Diagnosis: Left knee pain [M25.562]  Unilateral primary osteoarthritis, left knee [M17.12]  Infection of knee [facility]) [M00.9]   Referral Source: Lang Jayson SQUIBB, MD     Date of Initial Visit: 04/20/20 Attended Visits: 5 Missed Visits: 0     SUMMARY OF TREATMENT  Treatment focused on ROM and stability therex to improve left knee function.    CURRENT STATUS  Patient progressing well with reported 75% improvement in symptoms. Pain 3/10 at worst with pivot movement, stair negotiation and sit to stand. Pain free with ADLs on average. Patient increased standing tolerance to 15 minutes. Increased left knee AROM 0-115 degrees. Increased left knee extension and hip ER strength to 4+/5. Improved left LE SLS to 30 seconds with minimal sway. GROC +5. FOTO Score improved to 47 points.    Goal/Measure of Progress Goal Met?   1.  Left knee AROM 0 degrees of extension    Status at last Eval: 10 degrees Current Status: 0 degrees yes   2.  FOTO Score 37 points   Status at last Eval: 19 points Current Status: 47 points yes   3.  GROC +3   Status at last Eval: n/a Current Status: +5 yes     New Goals to be achieved in __4__  weeks:  1. Patient will increase FOTO Score to 55 points to increase tolerance to ADLs.  2. Patient will increase standing tolerance to 30 minutes to improve tolerance to cooking and meal preparation.  3. Patient will report pain 1/10 at worst to improve tolerance to sit to stand transfers.  RECOMMENDATIONS  Recommend continued PT 2 x week x 4 weeks to further improve tolerance to standing, stair negotiation and transfers.  If you have any questions/comments please contact us  directly at [phone].   Thank you for allowing us  to assist in the care of your  patient.    Therapist Signature: Rea Bernhardt, PT Date: 05/25/2020     Time: 5:21 PM   NOTE TO PHYSICIAN:  PLEASE COMPLETE THE ORDERS BELOW AND FAX TO   [facility]: [phone]  If you are unable to process this request in 24 hours please contact our office: [phone]    ___ I have read the above report and request that my patient continue as recommended.   ___ I have read the above report and request that my patient continue therapy with the following changes/special instructions:_________________________________________________________   ___ I have read the above report and request that my patient be discharged from therapy.     Physician Signature:        Date:       Time:                                 Lang Jayson SQUIBB, MD

## 2020-05-30 ENCOUNTER — Inpatient Hospital Stay
Admit: 2020-05-30 | Payer: BLUE CROSS/BLUE SHIELD | Primary: Student in an Organized Health Care Education/Training Program

## 2020-05-30 DIAGNOSIS — I10 Essential (primary) hypertension: Secondary | ICD-10-CM

## 2020-05-31 LAB — COMPREHENSIVE METABOLIC PANEL
ALT: 19 U/L (ref 12–78)
AST: 10 U/L — ABNORMAL LOW (ref 15–37)
Albumin: 4.2 gm/dl (ref 3.4–5.0)
Alkaline Phosphatase: 168 U/L — ABNORMAL HIGH (ref 45–117)
Anion Gap: 7 mmol/L (ref 5–15)
BUN: 9 mg/dl (ref 7–25)
CO2: 25 mEq/L (ref 21–32)
Calcium: 9.5 mg/dl (ref 8.5–10.1)
Chloride: 104 mEq/L (ref 98–107)
Creatinine: 0.9 mg/dl (ref 0.6–1.3)
EGFR IF NonAfrican American: >60
GFR African American: >60
Glucose: 211 mg/dl — ABNORMAL HIGH (ref 74–106)
Potassium: 4.3 mEq/L (ref 3.5–5.1)
Sodium: 135 mEq/L — ABNORMAL LOW (ref 136–145)
Total Bilirubin: 0.5 mg/dl (ref 0.2–1.0)
Total Protein: 8.2 gm/dl (ref 6.4–8.2)

## 2020-05-31 LAB — CBC
Hematocrit: 44.1 % (ref 37.0–50.0)
Hemoglobin: 13.7 gm/dl (ref 13.0–17.2)
MCH: 29.8 pg (ref 25.4–34.6)
MCHC: 31.1 gm/dl (ref 30.0–36.0)
MCV: 95.9 fL (ref 80.0–98.0)
MPV: 10.6 fL — ABNORMAL HIGH (ref 6.0–10.0)
Platelets: 356 10*3/uL (ref 140–450)
RBC: 4.6 M/uL (ref 3.60–5.20)
RDW-SD: 55.5 — ABNORMAL HIGH (ref 36.4–46.3)
WBC: 12.3 10*3/uL — ABNORMAL HIGH (ref 4.0–11.0)

## 2020-05-31 LAB — LIPID PANEL
CHOL/HDL Ratio: 5.3 Ratio — ABNORMAL HIGH (ref 0.0–4.4)
Chol/HDL Ratio: 5.3 Ratio — ABNORMAL HIGH (ref 0.0–4.4)
Cholesterol, Total: 206 mg/dl — ABNORMAL HIGH (ref 140–199)
Cholesterol, total: 206 mg/dl — ABNORMAL HIGH (ref 140–199)
HDL Cholesterol: 39 mg/dl — ABNORMAL LOW (ref 40–96)
HDL: 39 mg/dl — ABNORMAL LOW (ref 40–96)
LDL Calculated: 139 mg/dl — ABNORMAL HIGH (ref 0–130)
LDL, calculated: 139 mg/dl — ABNORMAL HIGH (ref 0–130)
Triglyceride: 142 mg/dl (ref 29–150)
Triglycerides: 142 mg/dl (ref 29–150)

## 2020-05-31 LAB — MICROALBUMIN / CREATININE URINE RATIO
CREATININE, URINE RANDOM: 60 mg/dl (ref 30.0–125.0)
Microalb, Ur: 18.3 mg/L (ref 0.0–29.9)
Microalbumin Creatinine Ratio: 31 mg/g — ABNORMAL HIGH (ref 0–30)

## 2020-05-31 LAB — HEMOGLOBIN A1C W/O EAG
Hemoglobin A1C: 8.9 % — ABNORMAL HIGH (ref 4.2–5.6)
Hemoglobin A1c: 8.9 % — ABNORMAL HIGH (ref 4.2–5.6)

## 2020-05-31 LAB — SEDIMENTATION RATE: Sed Rate: 7 mm/Hr (ref 0–20)

## 2020-05-31 LAB — HIV 1/2 RAPID SCREEN
HIV P24 ANTIGEN, RAPID, HIVP24AG: NONREACTIVE
HIV P24 ANTIGEN, RAPID: NONREACTIVE
HIV RAPID: NONREACTIVE
HIV Rapid: NONREACTIVE

## 2020-05-31 LAB — C-REACTIVE PROTEIN: CRP: 8.9 mg/L — ABNORMAL HIGH (ref 0.0–2.9)

## 2020-05-31 LAB — RHEUMATOID FACTOR, QUALITATIVE: Rheumatoid Factor: <10 IU/ml (ref 0–14)

## 2020-05-31 LAB — RPR W/REFLEX TITER AND CONFIRMATION
RPR: NONREACTIVE
RPR: NONREACTIVE

## 2020-05-31 LAB — MAGNESIUM
Magnesium: 2 mg/dl (ref 1.6–2.6)
Magnesium: 2 mg/dl (ref 1.6–2.6)

## 2020-05-31 LAB — CBC W/O DIFF
HCT: 44.1 % (ref 37.0–50.0)
HGB: 13.7 gm/dl (ref 13.0–17.2)
MCH: 29.8 pg (ref 25.4–34.6)
MCHC: 31.1 gm/dl (ref 30.0–36.0)
MCV: 95.9 fL (ref 80.0–98.0)
MPV: 10.6 fL — ABNORMAL HIGH (ref 6.0–10.0)
PLATELET: 356 10*3/uL (ref 140–450)
RBC: 4.6 M/uL (ref 3.60–5.20)
RDW-SD: 55.5 — ABNORMAL HIGH (ref 36.4–46.3)
WBC: 12.3 10*3/uL — ABNORMAL HIGH (ref 4.0–11.0)

## 2020-05-31 LAB — METABOLIC PANEL, COMPREHENSIVE
ALT (SGPT): 19 U/L (ref 12–78)
AST (SGOT): 10 U/L — ABNORMAL LOW (ref 15–37)
Albumin: 4.2 gm/dl (ref 3.4–5.0)
Alk. phosphatase: 168 U/L — ABNORMAL HIGH (ref 45–117)
Anion gap: 7 mmol/L (ref 5–15)
BUN: 9 mg/dl (ref 7–25)
Bilirubin, total: 0.5 mg/dl (ref 0.2–1.0)
CO2: 25 mEq/L (ref 21–32)
Calcium: 9.5 mg/dl (ref 8.5–10.1)
Chloride: 104 mEq/L (ref 98–107)
Creatinine: 0.9 mg/dl (ref 0.6–1.3)
GFR est AA: >60
GFR est non-AA: >60
Glucose: 211 mg/dl — ABNORMAL HIGH (ref 74–106)
Potassium: 4.3 mEq/L (ref 3.5–5.1)
Protein, total: 8.2 gm/dl (ref 6.4–8.2)
Sodium: 135 mEq/L — ABNORMAL LOW (ref 136–145)

## 2020-05-31 LAB — MICROALBUMIN, UR, RAND W/ MICROALB/CREAT RATIO
Creatinine, urine random: 60 mg/dl (ref 30.0–125.0)
Microalbumin,urine random: 18.3 mg/L (ref 0.0–29.9)
Microalbumin/Creat ratio (mg/g creat): 31 mg/g — ABNORMAL HIGH (ref 0–30)

## 2020-05-31 LAB — C REACTIVE PROTEIN, QT: C-Reactive protein: 8.9 mg/L — ABNORMAL HIGH (ref 0.0–2.9)

## 2020-05-31 LAB — SED RATE (ESR): Sed rate (ESR): 7 mm/Hr (ref 0–20)

## 2020-05-31 LAB — RHEUMATOID FACTOR, QL: Rheumatoid factor: <10 IU/ml (ref 0–14)

## 2020-06-01 ENCOUNTER — Inpatient Hospital Stay
Admit: 2020-06-01 | Payer: BLUE CROSS/BLUE SHIELD | Primary: Student in an Organized Health Care Education/Training Program

## 2020-06-01 LAB — EBV BY PCR
EBV DNA, QN PCR: <2.3 Log cps/mL (ref <2.30)
EBV DNA, QT PCR(copies/mL): <200 copies/mL (ref <200)

## 2020-06-01 NOTE — Progress Notes (Signed)
 PHYSICAL THERAPY - DAILY TREATMENT NOTE  8-14    Patient Name: Abigail Becker        Date: 06/01/2020  DOB: 1980/11/11   YES Patient DOB Verified  Visit #:   6   of   16  Insurance: Payor: BLUE CROSS / Plan: [employer] / Product Type: HMO /      In time: 5:18 Out time: 6:12   Total Treatment Time: 54     Medicare/BCBS Time Tracking (below)   Total Timed Codes (min):  44 1:1 Treatment Time:  40     TREATMENT AREA =  Left knee pain [M25.562]  Unilateral primary osteoarthritis, left knee [M17.12]  Infection of knee (HCC) [M00.9]    SUBJECTIVE    Pain Level (on 0 to 10 scale): 0 / 10   Medication Changes/New allergies or changes in medical history, any new surgeries or procedures?    NO    If yes, update Summary List   Subjective Functional Status/Changes:  []   No changes reported     Patient reports improved ease with stair negotiation.       OBJECTIVE  Modalities Rationale:     decrease pain to improve patient's ability to perform ADLs   min []  Estim, type/location:                                      []   att     []   unatt     []   w/US      []   w/ice    []   w/heat    min []   Mechanical Traction: type/lbs                   []   pro   []   sup   []   int   []   cont    []   before manual    []   after manual    min []   Ultrasound, settings/location:      min []   Iontophoresis w/ dexamethasone, location:                                               []   take home patch       []   in clinic   10 min [x]   Ice     []   Heat    location/position: Supine left knee    min []   Vasopneumatic Device, press/temp:        min []   Other:    [x]  Skin assessment post-treatment (if applicable):    []   intact    [x]   redness- no adverse reaction                  [] redness - adverse reaction:      40/  44 min Therapeutic Exercise:  [x]   See flow sheet   Rationale:      increase ROM and increase strength to improve the patient's ability to negotiate steps     Billed With/As:   [x]  TE   []  TA   []  Neuro   []  Self Care Patient Education: [x]   Review HEP    []  Progressed/Changed HEP based on:   []  positioning   []  body mechanics   []  transfers   []   heat/ice application    []  other:      Other Objective/Functional Measures:    Added miniband lat and fwd walks to improve LE strength  Progressed to noncompliant surface with tandem stance to improve LE stability     Post Treatment Pain Level (on 0 to 10) scale:   0  / 10     ASSESSMENT    Assessment/Changes in Function:     Patient noted anterior knee pain with foam SR EC, therefore performed on pillow to improve surface compliance. Cues to reduce R LE reach with lateral tap downs - may increase step height NV     [x]   See Progress Note/Recertification   Patient will continue to benefit from skilled PT services to modify and progress therapeutic interventions, address functional mobility deficits, address ROM deficits, address strength deficits, analyze and address soft tissue restrictions, analyze and cue movement patterns, analyze and modify body mechanics/ergonomics and assess and modify postural abnormalities to attain remaining goals.  to attain remaining goals.   Progress toward goals / Updated goals:    New Goals to be achieved in __4__  weeks:  1. Patient will increase FOTO Score to 55 points to increase tolerance to ADLs.  2. Patient will increase standing tolerance to 30 minutes to improve tolerance to cooking and meal preparation.  3. Patient will report pain 1/10 at worst to improve tolerance to sit to stand transfers. Progressing 01/12 indicated by pre vs post tx pain levels     PLAN    [x]   Upgrade activities as tolerated YES Continue plan of care   []   Discharge due to :    []   Other:      Therapist: Delene CHRISTELLA Pinal, LPTA    Date: 06/01/2020 Time: 7:10 PM     Future Appointments   Date Time Provider Department Center   06/08/2020  5:15 PM Pinal Delene CHRISTELLA [facility]   06/15/2020  5:15 PM [facility]   06/22/2020  5:15 PM [facility]

## 2020-06-04 LAB — ANTINUCLEAR ANTIBODIES, IFA
Antinuclear Abs, IFA: NEGATIVE
Antinuclear Abs, IFA: NEGATIVE

## 2020-06-08 ENCOUNTER — Inpatient Hospital Stay
Admit: 2020-06-08 | Payer: BLUE CROSS/BLUE SHIELD | Primary: Student in an Organized Health Care Education/Training Program

## 2020-06-08 NOTE — Progress Notes (Signed)
 PHYSICAL THERAPY - DAILY TREATMENT NOTE  8-14    Patient Name: Abigail Becker        Date: 06/08/2020  DOB: 1980/08/14   YES Patient DOB Verified  Visit #:   7   of   16  Insurance: Payor: BLUE CROSS / Plan: [employer] / Product Type: HMO /      In time: 5:14 Out time: 6:05   Total Treatment Time: 51     Medicare/BCBS Time Tracking (below)   Total Timed Codes (min):  41 1:1 Treatment Time:  41     TREATMENT AREA =  Left knee pain [M25.562]  Unilateral primary osteoarthritis, left knee [M17.12]  Infection of knee (HCC) [M00.9]    SUBJECTIVE    Pain Level (on 0 to 10 scale): 3 / 10   Medication Changes/New allergies or changes in medical history, any new surgeries or procedures?    NO    If yes, update Summary List   Subjective Functional Status/Changes:  []   No changes reported     Patient reports more L knee pain due to the weather.        OBJECTIVE  Modalities Rationale:     decrease pain to improve patient's ability to perform ADLs   min []  Estim, type/location:                                      []   att     []   unatt     []   w/US      []   w/ice    []   w/heat    min []   Mechanical Traction: type/lbs                   []   pro   []   sup   []   int   []   cont    []   before manual    []   after manual    min []   Ultrasound, settings/location:      min []   Iontophoresis w/ dexamethasone, location:                                               []   take home patch       []   in clinic   10 min [x]   Ice     []   Heat    location/position: Supine left knee    min []   Vasopneumatic Device, press/temp:        min []   Other:    [x]  Skin assessment post-treatment (if applicable):    []   intact    [x]   redness- no adverse reaction                  [] redness - adverse reaction:      40/  44 min Therapeutic Exercise:  [x]   See flow sheet   Rationale:      increase ROM and increase strength to improve the patient's ability to negotiate steps     Billed With/As:   [x]  TE   []  TA   []  Neuro   []  Self Care Patient Education: [x]   Review HEP    []  Progressed/Changed HEP based on:   []  positioning   []  body mechanics   []   transfers   []  heat/ice application    []  other:      Other Objective/Functional Measures:    *modified EC on noncompliant surface due to increase pain LV - performed with feet together and MSR arch. Demo improved tolerance and decrease pain  *progressed to 4 inch step with lateral tap downs      Post Treatment Pain Level (on 0 to 10) scale:   0  / 10     ASSESSMENT    Assessment/Changes in Function:     Patient noted no pain following PT session. Continues to demo quadriceps weakness throughout exercises, requiring cues for eccentric control during squats and lateral tap downs     [x]   See Progress Note/Recertification   Patient will continue to benefit from skilled PT services to modify and progress therapeutic interventions, address functional mobility deficits, address ROM deficits, address strength deficits, analyze and address soft tissue restrictions, analyze and cue movement patterns, analyze and modify body mechanics/ergonomics and assess and modify postural abnormalities to attain remaining goals.  to attain remaining goals.   Progress toward goals / Updated goals:    New Goals to be achieved in __4__  weeks:  1. Patient will increase FOTO Score to 55 points to increase tolerance to ADLs.  2. Patient will increase standing tolerance to 30 minutes to improve tolerance to cooking and meal preparation.  3. Patient will report pain 1/10 at worst to improve tolerance to sit to stand transfers. Progressing 01/12 indicated by pre vs post tx pain levels     PLAN    [x]   Upgrade activities as tolerated YES Continue plan of care   []   Discharge due to :    []   Other:      Therapist: Delene CHRISTELLA Pinal, LPTA    Date: 06/08/2020 Time: 6:51 PM     Future Appointments   Date Time Provider Department Center   06/15/2020  5:15 PM Bunny Song, PT [facility]   06/22/2020  5:15 PM Bunny Song, PT [facility]

## 2020-06-15 ENCOUNTER — Inpatient Hospital Stay
Admit: 2020-06-15 | Payer: BLUE CROSS/BLUE SHIELD | Primary: Student in an Organized Health Care Education/Training Program

## 2020-06-15 NOTE — Progress Notes (Signed)
PHYSICAL THERAPY - DAILY TREATMENT NOTE  8-14    Patient Name: Abigail Becker        Date: 06/15/2020  DOB: November 14, 1980   YES Patient DOB Verified  Visit #:   8   of   16  Insurance: Payor: BLUE CROSS / Plan: [employer] / Product Type: HMO /      In time: 5:20 Out time: 6:10   Total Treatment Time: 50     Medicare/BCBS Time Tracking (below)   Total Timed Codes (min):  40 1:1 Treatment Time:  40     TREATMENT AREA =  Left knee pain [M25.562]  Unilateral primary osteoarthritis, left knee [M17.12]  Infection of knee (HCC) [M00.9]    SUBJECTIVE    Pain Level (on 0 to 10 scale):  2  / 10   Medication Changes/New allergies or changes in medical history, any new surgeries or procedures?    NO    If yes, update Summary List   Subjective Functional Status/Changes:  []   No changes reported       Functional improvements: walking, standing  Functional impairments: squatting         OBJECTIVE  Modalities Rationale:     decrease pain to improve patient's ability to perform ADLs   min []  Estim, type/location:                                      []   att     []   unatt     []   w/US     []   w/ice    []   w/heat    min []   Mechanical Traction: type/lbs                   []   pro   []   sup   []   int   []   cont    []   before manual    []   after manual    min []   Ultrasound, settings/location:      min []   Iontophoresis w/ dexamethasone, location:                                               []   take home patch       []   in clinic   10 min [x]   Ice     []   Heat    location/position: Supine left knee    min []   Vasopneumatic Device, press/temp:        min []   Other:    [x]  Skin assessment post-treatment (if applicable):    []   intact    [x]   redness- no adverse reaction                  [] redness - adverse reaction:      40 min Therapeutic Exercise:  [x]   See flow sheet   Rationale:      increase ROM and increase strength to improve the patient's ability to perform ADLs     Billed With/As:   [x]  TE   []  TA   []  Neuro   []  Self Care  Patient Education: [x]  Review HEP    []  Progressed/Changed HEP based on:   []  positioning   []  body mechanics   []   transfers   []  heat/ice application    []  other:      Other Objective/Functional Measures:    Good tolerance to therex progression without complaints of increased pain.     Post Treatment Pain Level (on 0 to 10) scale:   0  / 10     ASSESSMENT    Assessment/Changes in Function:     Patient reporting improved walking-cadence and change in direction, stair negotiation and standing tolerance. Patient had follow up with MD on 06/10/20. Next visit in three months. Reassess next visit for PN/DC.     []   See Progress Note/Recertification   Patient will continue to benefit from skilled PT services to modify and progress therapeutic interventions, address functional mobility deficits, address ROM deficits, address strength deficits, analyze and address soft tissue restrictions, analyze and cue movement patterns, analyze and modify body mechanics/ergonomics and assess and modify postural abnormalities to attain remaining goals.  to attain remaining goals.   Progress toward goals / Updated goals:    Good Progress to    []  STG    [x]  LTG  3 as shown by pain 3/10.     PLAN    [x]   Upgrade activities as tolerated YES Continue plan of care   []   Discharge due to :    []   Other:      Therapist: Debera Lat, PT    Date: 06/15/2020 Time: 5:26 PM     Future Appointments   Date Time Provider Department Center   06/22/2020  5:15 PM Debera Lat, PT [facility]

## 2020-06-22 ENCOUNTER — Inpatient Hospital Stay
Admit: 2020-06-22 | Payer: BLUE CROSS/BLUE SHIELD | Primary: Student in an Organized Health Care Education/Training Program

## 2020-06-22 DIAGNOSIS — M25562 Pain in left knee: Secondary | ICD-10-CM

## 2020-06-22 NOTE — Progress Notes (Signed)
 PHYSICAL THERAPY - DAILY TREATMENT NOTE  8-14    Patient Name: Abigail Becker        Date: 06/22/2020  DOB: Jul 14, 1980   YES Patient DOB Verified  Visit #:   9   of   16  Insurance: Payor: BLUE CROSS / Plan: [employer] / Product Type: HMO /      In time: 5:15 Out time: 6:05   Total Treatment Time: 50     Medicare/BCBS Time Tracking (below)   Total Timed Codes (min):  40 1:1 Treatment Time:  40     TREATMENT AREA =  Left knee pain [M25.562]  Unilateral primary osteoarthritis, left knee [M17.12]  Infection of knee (HCC) [M00.9]    SUBJECTIVE    Pain Level (on 0 to 10 scale):  0  / 10   Medication Changes/New allergies or changes in medical history, any new surgeries or procedures?    NO    If yes, update Summary List   Subjective Functional Status/Changes:  []   No changes reported       See D/C         OBJECTIVE  Modalities Rationale:     decrease pain to improve patient's ability to perform ADLs   min []  Estim, type/location:                                      []   att     []   unatt     []   w/US      []   w/ice    []   w/heat    min []   Mechanical Traction: type/lbs                   []   pro   []   sup   []   int   []   cont    []   before manual    []   after manual    min []   Ultrasound, settings/location:      min []   Iontophoresis w/ dexamethasone, location:                                               []   take home patch       []   in clinic   10 min [x]   Ice     []   Heat    location/position: Partial reclined left knee    min []   Vasopneumatic Device, press/temp:        min []   Other:    [x]  Skin assessment post-treatment (if applicable):    []   intact    [x]   redness- no adverse reaction                  [] redness - adverse reaction:      40 min Therapeutic Exercise:  [x]   See flow sheet   Rationale:      increase ROM and increase strength to improve the patient's ability to perform ADLs     Billed With/As:   [x]  TE   []  TA   []  Neuro   []  Self Care Patient Education: [x]  Review HEP    []  Progressed/Changed HEP  based on:   []  positioning   []  body mechanics   []  transfers   []   heat/ice application    []  other:      Other Objective/Functional Measures:    See D/C     Post Treatment Pain Level (on 0 to 10) scale:   0  / 10     ASSESSMENT    Assessment/Changes in Function:     See D/C     []   See Progress Note/Recertification   Patient will continue to benefit from skilled PT services to modify and progress therapeutic interventions, address functional mobility deficits, address ROM deficits, address strength deficits, analyze and address soft tissue restrictions, analyze and cue movement patterns, analyze and modify body mechanics/ergonomics and assess and modify postural abnormalities to attain remaining goals.  to attain remaining goals.   Progress toward goals / Updated goals:    See D/C     PLAN    []   Upgrade activities as tolerated YES Continue plan of care   [x]   Discharge due to : Progress toward goals   []   Other:      Therapist: Rea Bernhardt, PT    Date: 06/22/2020 Time: 5:40 PM   No future appointments.

## 2020-06-22 NOTE — Progress Notes (Signed)
 [facility] - [facility] PHYSICAL THERAPY AT [facility]  [address],[place]  [place], [place] [address] - Phone: [phone]  Fax: [phone]  DISCHARGE SUMMARY  Patient Name: Abigail Becker DOB: Apr 03, 1981   Treatment/Medical Diagnosis: Left knee pain [M25.562]  Unilateral primary osteoarthritis, left knee [M17.12]  Infection of knee [facility]) [M00.9]   Referral Source: Lang Jayson SQUIBB, MD     Date of Initial Visit: 04/20/20 Attended Visits: 9 Missed Visits: 3     SUMMARY OF TREATMENT  Treatment focused on therex to improve left knee stability, ROM and function during ADLs.    CURRENT STATUS  Patient progressing well with pain 2/10 at worst. Improved tolerance to walking and stair negotiation. Improved standing tolerance to 30 minutes. FOTO Score improved from 19 to 52 points. Patient able to complete ADLs without significant limitations. Patient followed up with PCP and blood work revealed elevated WBC. Patient to follow up with PCP and surgeon regarding further interventions due to possible return of infection in left knee.    Previous Goals:  1. Patient will increase FOTO Score to 55 points to increase tolerance to ADLs.  2. Patient will increase standing tolerance to 30 minutes to improve tolerance to cooking and meal preparation.  3. Patient will report pain 1/10 at worst to improve tolerance to sit to stand transfers.     Prior Level/Current Level:  1) Prior Level: 47 points   Current Level: 52 points   Goal Met? no  2) Prior Level: 15 minutes   Current Level: 30 minues   Goal Met? yes  3) Prior Level: pain 3/10 at worst   Current Level: pain 2/10 at worst   Goal Met? no    RECOMMENDATIONS  Discontinue therapy. Progressing towards or have reached established goals.  If you have any questions/comments please contact us  directly at [phone].   Thank you for allowing us  to assist in the care of your patient.    Therapist Signature: Rea Bernhardt, PT Date: 06/22/20     Time: 3:43  PM

## 2021-10-04 ENCOUNTER — Inpatient Hospital Stay: Admit: 2021-10-04 | Payer: BLUE CROSS/BLUE SHIELD

## 2021-10-04 DIAGNOSIS — E1165 Type 2 diabetes mellitus with hyperglycemia: Secondary | ICD-10-CM

## 2021-10-05 LAB — COMPREHENSIVE METABOLIC PANEL
ALT: 11 U/L (ref 10–49)
AST: <8 U/L (ref 0.0–33.9)
Albumin: 3.9 gm/dl (ref 3.4–5.0)
Alkaline Phosphatase: 129 U/L — ABNORMAL HIGH (ref 46–116)
Anion Gap: 9 mmol/L (ref 5–15)
BUN: 14 mg/dl (ref 9–23)
CO2: 27 mEq/L (ref 20–31)
Calcium: 9.4 mg/dl (ref 8.7–10.4)
Chloride: 105 mEq/L (ref 98–107)
Creatinine: 0.94 mg/dl (ref 0.55–1.02)
GFR African American: >60
GFR Non-African American: >60
Glucose: 179 mg/dl — ABNORMAL HIGH (ref 74–106)
Potassium: 4.3 mEq/L (ref 3.5–5.1)
Sodium: 141 mEq/L (ref 136–145)
Total Bilirubin: 0.3 mg/dl (ref 0.30–1.20)
Total Protein: 7.1 gm/dl (ref 5.7–8.2)

## 2021-10-05 LAB — LIPID PANEL
Chol/HDL Ratio: 5.1 Ratio — ABNORMAL HIGH (ref 0.0–4.4)
Cholesterol: 194 mg/dl (ref 0–199)
HDL: 38 mg/dl — ABNORMAL LOW (ref 40–60)
LDL Calculated: 127 mg/dl (ref 0–130)
Triglycerides: 144 mg/dl (ref 0–150)

## 2021-10-05 LAB — MAGNESIUM: Magnesium: 1.8 mg/dL (ref 1.6–2.6)

## 2021-10-05 LAB — HEMOGLOBIN A1C: Hemoglobin A1C: 9.6 % — ABNORMAL HIGH (ref 3.8–5.6)
